# Patient Record
Sex: Female | Born: 1970 | Race: White | Hispanic: No | Smoking: Never smoker
Health system: Southern US, Community
[De-identification: ages and names within clinical notes are randomized; demographics above are authoritative.]

## PROBLEM LIST (undated history)

## (undated) DIAGNOSIS — N644 Mastodynia: Secondary | ICD-10-CM

## (undated) DIAGNOSIS — N898 Other specified noninflammatory disorders of vagina: Secondary | ICD-10-CM

## (undated) DIAGNOSIS — N8189 Other female genital prolapse: Secondary | ICD-10-CM

## (undated) DIAGNOSIS — O99345 Other mental disorders complicating the puerperium: Secondary | ICD-10-CM

## (undated) DIAGNOSIS — N83209 Unspecified ovarian cyst, unspecified side: Secondary | ICD-10-CM

## (undated) DIAGNOSIS — N9089 Other specified noninflammatory disorders of vulva and perineum: Secondary | ICD-10-CM

## (undated) DIAGNOSIS — K649 Unspecified hemorrhoids: Secondary | ICD-10-CM

## (undated) DIAGNOSIS — F53 Postpartum depression: Secondary | ICD-10-CM

## (undated) DIAGNOSIS — IMO0002 Reserved for concepts with insufficient information to code with codable children: Secondary | ICD-10-CM

## (undated) HISTORY — DX: Reserved for concepts with insufficient information to code with codable children: IMO0002

## (undated) HISTORY — PX: WISDOM TOOTH EXTRACTION: SHX21

## (undated) HISTORY — PX: OTHER SURGICAL HISTORY: SHX169

## (undated) HISTORY — PX: BARTHOLIN CYST MARSUPIALIZATION: SHX5383

## (undated) HISTORY — DX: Other specified noninflammatory disorders of vagina: N89.8

## (undated) HISTORY — DX: Mastodynia: N64.4

## (undated) HISTORY — DX: Other female genital prolapse: N81.89

## (undated) HISTORY — DX: Other mental disorders complicating the puerperium: O99.345

## (undated) HISTORY — DX: Unspecified ovarian cyst, unspecified side: N83.209

## (undated) HISTORY — DX: Postpartum depression: F53.0

## (undated) HISTORY — DX: Unspecified hemorrhoids: K64.9

## (undated) HISTORY — DX: Other specified noninflammatory disorders of vulva and perineum: N90.89

---

## 1997-08-15 ENCOUNTER — Ambulatory Visit (HOSPITAL_COMMUNITY): Admission: RE | Admit: 1997-08-15 | Discharge: 1997-08-15 | Payer: Self-pay | Admitting: Obstetrics and Gynecology

## 1997-09-24 ENCOUNTER — Inpatient Hospital Stay (HOSPITAL_COMMUNITY): Admission: AD | Admit: 1997-09-24 | Discharge: 1997-09-24 | Payer: Self-pay | Admitting: Obstetrics and Gynecology

## 1997-11-09 ENCOUNTER — Inpatient Hospital Stay: Admission: AD | Admit: 1997-11-09 | Discharge: 1997-11-09 | Payer: Self-pay | Admitting: Obstetrics and Gynecology

## 1997-12-07 ENCOUNTER — Inpatient Hospital Stay (HOSPITAL_COMMUNITY): Admission: AD | Admit: 1997-12-07 | Discharge: 1997-12-09 | Payer: Self-pay | Admitting: Obstetrics and Gynecology

## 1999-03-04 ENCOUNTER — Other Ambulatory Visit: Admission: RE | Admit: 1999-03-04 | Discharge: 1999-03-04 | Payer: Self-pay | Admitting: Obstetrics and Gynecology

## 2002-06-02 DIAGNOSIS — N644 Mastodynia: Secondary | ICD-10-CM

## 2002-06-02 DIAGNOSIS — K649 Unspecified hemorrhoids: Secondary | ICD-10-CM

## 2002-06-02 HISTORY — DX: Unspecified hemorrhoids: K64.9

## 2002-06-02 HISTORY — DX: Mastodynia: N64.4

## 2002-09-20 ENCOUNTER — Other Ambulatory Visit: Admission: RE | Admit: 2002-09-20 | Discharge: 2002-09-20 | Payer: Self-pay | Admitting: Obstetrics and Gynecology

## 2003-09-27 ENCOUNTER — Other Ambulatory Visit: Admission: RE | Admit: 2003-09-27 | Discharge: 2003-09-27 | Payer: Self-pay | Admitting: Obstetrics and Gynecology

## 2004-06-02 DIAGNOSIS — N83209 Unspecified ovarian cyst, unspecified side: Secondary | ICD-10-CM

## 2004-06-02 HISTORY — DX: Unspecified ovarian cyst, unspecified side: N83.209

## 2004-10-08 ENCOUNTER — Other Ambulatory Visit: Admission: RE | Admit: 2004-10-08 | Discharge: 2004-10-08 | Payer: Self-pay | Admitting: Obstetrics and Gynecology

## 2005-10-14 ENCOUNTER — Other Ambulatory Visit: Admission: RE | Admit: 2005-10-14 | Discharge: 2005-10-14 | Payer: Self-pay | Admitting: Obstetrics and Gynecology

## 2006-01-18 ENCOUNTER — Inpatient Hospital Stay (HOSPITAL_COMMUNITY): Admission: AD | Admit: 2006-01-18 | Discharge: 2006-01-18 | Payer: Self-pay | Admitting: Obstetrics and Gynecology

## 2006-04-16 ENCOUNTER — Inpatient Hospital Stay (HOSPITAL_COMMUNITY): Admission: AD | Admit: 2006-04-16 | Discharge: 2006-04-16 | Payer: Self-pay | Admitting: Obstetrics and Gynecology

## 2006-04-17 ENCOUNTER — Inpatient Hospital Stay (HOSPITAL_COMMUNITY): Admission: AD | Admit: 2006-04-17 | Discharge: 2006-04-17 | Payer: Self-pay | Admitting: Obstetrics and Gynecology

## 2006-06-01 ENCOUNTER — Inpatient Hospital Stay (HOSPITAL_COMMUNITY): Admission: AD | Admit: 2006-06-01 | Discharge: 2006-06-03 | Payer: Self-pay | Admitting: Obstetrics and Gynecology

## 2006-12-14 ENCOUNTER — Inpatient Hospital Stay (HOSPITAL_COMMUNITY): Admission: AD | Admit: 2006-12-14 | Discharge: 2006-12-14 | Payer: Self-pay | Admitting: Obstetrics and Gynecology

## 2006-12-17 ENCOUNTER — Ambulatory Visit (HOSPITAL_COMMUNITY): Admission: RE | Admit: 2006-12-17 | Discharge: 2006-12-17 | Payer: Self-pay | Admitting: Obstetrics and Gynecology

## 2010-06-02 DIAGNOSIS — N9089 Other specified noninflammatory disorders of vulva and perineum: Secondary | ICD-10-CM

## 2010-06-02 HISTORY — DX: Other specified noninflammatory disorders of vulva and perineum: N90.89

## 2010-07-15 ENCOUNTER — Other Ambulatory Visit: Payer: Self-pay | Admitting: Obstetrics and Gynecology

## 2010-07-15 DIAGNOSIS — Z1231 Encounter for screening mammogram for malignant neoplasm of breast: Secondary | ICD-10-CM

## 2010-07-25 ENCOUNTER — Ambulatory Visit
Admission: RE | Admit: 2010-07-25 | Discharge: 2010-07-25 | Disposition: A | Discharge status: Home / Self Care | Payer: Managed Care, Other (non HMO) | Source: Ambulatory Visit | Attending: Obstetrics and Gynecology | Admitting: Obstetrics and Gynecology

## 2010-07-25 DIAGNOSIS — Z1231 Encounter for screening mammogram for malignant neoplasm of breast: Secondary | ICD-10-CM

## 2010-10-15 NOTE — H&P (Signed)
[name], [name]               ACCOUNT NO.:  [id number]   MEDICAL RECORD NO.:  [id number]          PATIENT TYPE:  AMB   LOCATION:  SDC                           FACILITY:  [facility]   PHYSICIAN:  Crist Fat. Rivard, M.D. DATE OF BIRTH:  09-11-1970   DATE OF ADMISSION:  12/17/2006  DATE OF DISCHARGE:                              HISTORY & PHYSICAL   REASON FOR ADMISSION:  Recurrent right Bartholin gland abscess.   HISTORY OF PRESENT ILLNESS:  This is a 40 year old married white female  who presents today with persistence of right Bartholin gland abscess  after having it incised and drained on July 14 and after having  completed one full course of Keflex and currently being on her second  course.  She reports swelling and pain in the right area.  This requires  the use of Vicodin every 4 hours and brings minimal relief.  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  Her left side  Bartholin gland was marsupialized many years ago and this achieved great  success.  She is in today to undergo marsupialization of the right  Bartholin gland.   PAST MEDICAL HISTORY:  1. Wisdom teeth removal.  2. Gravida 3, para 3 with three spontaneous vaginal deliveries in      September 1994, July 1999 and December 2007.   ALLERGIES:  There are no known drug allergies.  No latex allergies.   CURRENT MEDICATIONS:  1. Zoloft 50 mg p.o. daily.  2. Keflex 500 mg p.o. t.i.d.   SOCIAL HISTORY:  Married.  Is an Art gallery manager.  Lives with her husband and 3  children.  Nonsmoker.   FAMILY HISTORY:  Both parents with hypertension.  Mother with type 2  diabetes.   REVIEW OF SYSTEMS:  Negative.   PHYSICAL EXAMINATION:  VITAL SIGNS:  Current weight is 157 pounds for a  height of 5 feet 3-1/2 inches.  Blood pressure 122/82.  HEAD, EYES, EARS, NOSE AND THROAT:  Negative.  HEART:  Normal.  LUNGS:  Clear.  ABDOMEN:  Soft,  nontender.  No hepatosplenomegaly.  EXTREMITIES:  Negative.  NEUROLOGIC:  Within normal limits.  GYN:  Examination reveals an inflamed and painful right Bartholin gland,  increased in size, measuring 5 x 2.5 cm, with a previous site of  incision and drainage still persistent.  Pelvic examination is deferred  at this time.   ASSESSMENT:  Recurrent right Bartholin gland abscess.   PLAN:  Right Bartholin abscess marsupialization with Dr. Pennie Rushing.  The  procedure has been reviewed with the patient including risks and  benefits.  The patient is instructed to continue her antibiotic regimen  post procedure as well as to perform sitz baths twice a day for 5 to 7  days.  A followup appointment is scheduled in 2 weeks with Dr. Estanislado Pandy in  the office.      Crist Fat Rivard, M.D.  Electronically Signed     SAR/MEDQ  D:  12/17/2006  T:  12/17/2006  Job:  [id number]

## 2010-10-15 NOTE — Op Note (Signed)
NAMEAARYANA, BETKE               ACCOUNT NO.:  [id number]   MEDICAL RECORD NO.:  [id number]          PATIENT TYPE:  AMB   LOCATION:  SDC                           [facility]ILITY:  [facility]   PHYSICIAN:  Hal Morales, M.D.DATE OF BIRTH:  05-18-1971   DATE OF PROCEDURE:  12/17/2006  DATE OF DISCHARGE:                               OPERATIVE REPORT   PREOPERATIVE DIAGNOSIS:  Recurrent right Bartholin's abscess.   POSTOPERATIVE DIAGNOSIS:  Recurrent right Bartholin's abscess.   OPERATION:  Right Bartholin's gland marsupialization.   SURGEON:  Dr. Dierdre Forth   ANESTHESIA:  General LMA.   ESTIMATED BLOOD LOSS:  Less than 25 mL.   COMPLICATIONS:  None.   FINDINGS:  The Bartholin's gland was enlarged to approximately 5 x 5 cm  and was exquisitely tender.  Upon entry into the Bartholin's gland a  large amount of purulent material egressed.   PROCEDURE:  The patient was taken to the operating room after  appropriate identification placed on the operating table.  After the  attainment of adequate general anesthesia she was placed in the  lithotomy position.  The perineum and vagina were prepped with multiple  layers of Betadine and draped in sterile field.  The bladder was emptied  with an in-and-out catheter.  The right Bartholin's gland was then  incised along the squamomucosal line and a large amount of purulent  materials egressed from the 3 cm opening.  Copious irrigation was  carried out and interrupted sutures of 4-0 Vicryl were used to sew the  gland mucosa to the vulvar mucosa, allowing for a circumferential  opening of the Bartholin's gland.  Once this was carried out.  Hemostasis was noted to be adequate and copious irrigation again carried  out.  The patient was awakened from general anesthesia and taken to the  recovery room in satisfactory condition having tolerated the procedure  well with sponge and instrument counts correct.      Hal Morales, M.D.  Electronically Signed     VPH/MEDQ  D:  12/17/2006  T:  12/17/2006  Job:  [id number]

## 2010-10-18 NOTE — H&P (Signed)
NAMEGENNETT, GARCIA               ACCOUNT NO.:  [id number]   MEDICAL RECORD NO.:  [id number]          PATIENT TYPE:  INP   LOCATION:  9170                          [facility]ACILITY:  [facility]   PHYSICIAN:  Crist Fat. Rivard, M.D. DATE OF BIRTH:  Aug 10, 1970   DATE OF ADMISSION:  06/01/2006  DATE OF DISCHARGE:                              HISTORY & PHYSICAL   Ms. Jon Billings is a 40 year old, gravida 3, para 2-0-0-2 at 39-6/7 weeks  who presented initially for induction secondary to advanced cervical  change but presented this morning contracting every 5 minutes. The  cervix has been 3 cm in the office. The pregnancy has been remarkable  for 1) first trimester bleeding, 2) advanced maternal age with amnio  decline and quadruple screen decline.   PRENATAL LABS:  Blood type is A+, Rh antibody negative, VDRL  nonreactive, rubella titer positive, hepatitis B surface antigen  negative, cystic fibrosis testing was negative, GC and chlamydia  cultures were negative. Pap was normal in March. Hemoglobin upon  entering the practice was 14.3, it was 12.3 at 26 weeks. EDC of June 09, 2006 was established by first trimester ultrasound and was in  agreement with LMP. The patient declined quadruple screen and amnio.  Glucola was elevated at 138, three-hour GTT was normal. She had a fetal  fibronectin at 29 weeks which was negative. She had a negative group B  strep at term.   HISTORY OF PRESENT PREGNANCY:  The patient entered care at approximately  10 weeks. She had an ultrasound in the first trimester secondary to  first trimester bleeding. She then had another ultrasound at 19 weeks  showing normal growth and development. She declined amnio and quadruple  screen. Her adjusted risk for Down syndrome was 1/476. Hemoglobin was  normal, Glucola was elevated at 138. She had a negative 3-hour GTT. She  did have some viral symptoms at 27 weeks. She did resolve spontaneously.  At 29 weeks, she was having some issues  with skin tags on her chest. She  requested these be removed, these were removed by Henreitta Leber at 30  weeks. She also had some contractions at that time and was evaluated  with a fetal fibronectin. The rest of her pregnancy was uncomplicated.  She had a GC and chlamydia, group B strep culture done at 35 weeks that  was normal.   OBSTETRICAL HISTORY:  In 1994, she had a vaginal birth of a female  infant, weight 7 pounds 14 ounces at 39 weeks, she was in labor 2 hours.  It was a vacuum-assisted vaginal birth. In 1999, she had a vaginal birth  of a female infant that weighed 8 pounds 14 ounces at 40 weeks, she was in  labor 13 hours, she had no anesthesia.   MEDICAL HISTORY:  She was on birth control pills in the past and a  Nuvaring in the past. She reports the usual childhood illnesses. She had  a UTI treated in the past with antibiotics.   PAST SURGICAL HISTORY:  Wisdom teeth removal.   Her only other hospitalization was for childbirth. She  has sensitivity  to carob which causes hives.   FAMILY HISTORY:  Paternal grandfather had heart disease, both parents  have hypertension and are on medication. Her sister and aunt have  varicose veins, her mother has type 2 diabetes, her mother has had a  stroke. Maternal aunt had lung cancer, paternal uncle had lung cancer.   GENETIC HISTORY:  Remarkable for the patient's advanced maternal age of  37. Her paternal aunt had a children with heart disease and her brother  has twins.   SOCIAL HISTORY:  The patient is married to the father of the baby. He is  involved and supportive. His name is Sinclair Grooms. The patient is  college educated, she is an Art gallery manager. Her husband is high school  educated, he is employed with [employer], clinical. She has been followed  by the Certified Nurse Midwife Service  at [facility]. She  denies any alcohol, drug or tobacco use during this pregnancy. She is  Caucasian and of the [place] faith.    PHYSICAL EXAMINATION:  VITAL SIGNS:  Stable, the patient is afebrile.  HEENT:  Within normal limits.  LUNGS:  Bilateral breath sounds are clear.  HEART:  Regular rate and rhythm without murmur.  BREASTS:  Soft and nontender.  ABDOMEN:  Fundal height is approximately 38 cm, estimated fetal weight 7-  8 pounds. Uterine contractions are every 5-6 minutes, moderate quality.  Cervical exam is 4 cm, 80% vertex at a -1 station with an intact bag of  water. Fetal heart rate is active with no decelerations.  EXTREMITIES:  Deep tendon reflexes are 2+ without clonus, there is no  trace edema noted.   IMPRESSION:  1. Intrauterine pregnancy at 39-6/7 weeks.  2. Early labor.  3. Negative group B strep.   PLAN:  1. Admit to birthing suite for consult with Dr. Estanislado Pandy as attending      physician.  2. Routine certified nurse midwife orders.  3. Plan observation at present and then will reevaluate in      approximately 2 hours for possible artificial rupture of membranes      for labor augmentation.  4. Pain medication p.r.n. per patient request.      Renaldo Reel. Emilee Hero, C.N.M.      Crist Fat Rivard, M.D.  Electronically Signed    VLL/MEDQ  D:  06/01/2006  T:  06/01/2006  Job:  [id number]

## 2011-03-17 LAB — CBC
MCHC: 34
MCV: 87.1
RBC: 4.53
RDW: 13.2

## 2011-03-18 LAB — CBC
MCHC: 34.8
MCV: 86.5
Platelets: 172
WBC: 9.7

## 2011-03-18 LAB — DIFFERENTIAL
Basophils Relative: 0
Eosinophils Absolute: 0
Lymphs Abs: 0.6 — ABNORMAL LOW
Neutro Abs: 8.8 — ABNORMAL HIGH
Neutrophils Relative %: 91 — ABNORMAL HIGH

## 2011-06-18 ENCOUNTER — Other Ambulatory Visit: Payer: Self-pay | Admitting: Obstetrics and Gynecology

## 2011-06-18 DIAGNOSIS — Z1231 Encounter for screening mammogram for malignant neoplasm of breast: Secondary | ICD-10-CM

## 2011-07-28 ENCOUNTER — Ambulatory Visit
Admission: RE | Admit: 2011-07-28 | Discharge: 2011-07-28 | Disposition: A | Discharge status: Home / Self Care | Payer: Managed Care, Other (non HMO) | Source: Ambulatory Visit | Attending: Obstetrics and Gynecology | Admitting: Obstetrics and Gynecology

## 2011-07-28 DIAGNOSIS — Z1231 Encounter for screening mammogram for malignant neoplasm of breast: Secondary | ICD-10-CM

## 2011-07-30 DIAGNOSIS — R87619 Unspecified abnormal cytological findings in specimens from cervix uteri: Secondary | ICD-10-CM

## 2011-07-30 DIAGNOSIS — IMO0002 Reserved for concepts with insufficient information to code with codable children: Secondary | ICD-10-CM

## 2011-07-30 HISTORY — DX: Reserved for concepts with insufficient information to code with codable children: IMO0002

## 2011-07-30 HISTORY — DX: Unspecified abnormal cytological findings in specimens from cervix uteri: R87.619

## 2011-12-11 ENCOUNTER — Ambulatory Visit (INDEPENDENT_AMBULATORY_CARE_PROVIDER_SITE_OTHER): Payer: BC Managed Care – PPO | Admitting: Obstetrics and Gynecology

## 2011-12-11 ENCOUNTER — Encounter: Payer: Self-pay | Admitting: Obstetrics and Gynecology

## 2011-12-11 VITALS — BP 100/64 | Ht 64.0 in | Wt 142.0 lb

## 2011-12-11 DIAGNOSIS — Z309 Encounter for contraceptive management, unspecified: Secondary | ICD-10-CM

## 2011-12-11 DIAGNOSIS — R87610 Atypical squamous cells of undetermined significance on cytologic smear of cervix (ASC-US): Secondary | ICD-10-CM

## 2011-12-11 NOTE — Progress Notes (Signed)
HISTORY OF PRESENT ILLNESS  [name] is a 41 y.o. year old female,No obstetric history on file., who presents for a problem visit. She had an IUD placed in 2008.  She wants to have it removed.  She no longer needs to use contraception.  She had a Pap smear in February of 2013 which showed ASCUS.  HPV was negative.  Subjective:  Doing well.  Her children are going to [place] for the summer.  Objective:  BP 100/64  Ht 5\' 4"  (1.626 m)  Wt 142 lb (64.411 kg)  BMI 24.37 kg/m2  LMP 12/04/2011   General: alert and no distress GI: soft, non-tender; bowel sounds normal; no masses,  no organomegaly  External genitalia: normal general appearance Vaginal: normal without tenderness, induration or masses and relaxation noted Cervix: normal appearance and IUD string visualized Adnexa: normal bimanual exam Uterus: normal size shape and consistency  Procedure to remove the IUD:  Procedure discussed with the patient.  Questions answered.  Speculum exam performed.  The IUD string is held with a ring forcep.  The patient was asked to cough.  The IUD was removed in its entirety without difficulty.  The patient tolerated the procedure well.  There is no bleeding noted.  Assessment:  Ascus Pap Contraceptive management  Plan:  Repeat Pap in August 2013  Return to office in 7 month(s) for annual exam.   Leonard Schwartz M.D.  12/11/2011 2:45 PM

## 2012-01-05 ENCOUNTER — Ambulatory Visit (INDEPENDENT_AMBULATORY_CARE_PROVIDER_SITE_OTHER): Payer: BC Managed Care – PPO | Admitting: Obstetrics and Gynecology

## 2012-01-05 ENCOUNTER — Encounter: Payer: Self-pay | Admitting: Obstetrics and Gynecology

## 2012-01-05 VITALS — BP 100/60 | Ht 64.0 in | Wt 142.0 lb

## 2012-01-05 DIAGNOSIS — Z124 Encounter for screening for malignant neoplasm of cervix: Secondary | ICD-10-CM

## 2012-01-05 DIAGNOSIS — N920 Excessive and frequent menstruation with regular cycle: Secondary | ICD-10-CM

## 2012-01-05 DIAGNOSIS — R87610 Atypical squamous cells of undetermined significance on cytologic smear of cervix (ASC-US): Secondary | ICD-10-CM

## 2012-01-05 MED ORDER — NORETHIN ACE-ETH ESTRAD-FE 1-20 MG-MCG PO TABS: 1.0000 | ORAL_TABLET | Freq: Every day | ORAL | Status: DC

## 2012-01-05 NOTE — Progress Notes (Signed)
HISTORY OF PRESENT ILLNESS  [name] is a 41 y.o. year old female,[id number], who presents for a problem visit. The patient has a history of an ASCUS Pap.  Subjective:  The patient reports that since her IUD was removed her periods have been very heavy.  Objective:  BP 100/60  Ht 5\' 4"  (1.626 m)  Wt 142 lb (64.411 kg)  BMI 24.37 kg/m2  LMP 12/31/2011   General: alert and no distress GI: soft, non-tender; bowel sounds normal; no masses,  no organomegaly  External genitalia: normal general appearance Vaginal: normal without tenderness, induration or masses and relaxation noted Cervix: normal appearance Adnexa: normal bimanual exam Uterus: normal size shape and consistency  Assessment:  Ascus Pap Heavy menses  Plan:  Pap smear sent Contraceptive options were reviewed.  Loestrin 1.0/ 20 sent to the pharmacy.  The patient may want Mirena IUD.  Return to office in 6 month(s).   Leonard Schwartz M.D.  01/05/2012 5:39 PM    Last Pap Normal: no Date: 07/21/2011 Grade: ASC-US High Risk HPV: no Vaginal Discharge:no Prior LEEP:no Prior Conization:no Prior Cryotherapy:no Prior Lazer:no

## 2012-01-06 ENCOUNTER — Telehealth: Payer: Self-pay | Admitting: Obstetrics and Gynecology

## 2012-01-06 NOTE — Telephone Encounter (Signed)
TRIAGE/RX °

## 2012-01-07 ENCOUNTER — Other Ambulatory Visit: Payer: Self-pay

## 2012-01-07 LAB — PAP IG W/ RFLX HPV ASCU

## 2012-01-07 NOTE — Telephone Encounter (Signed)
Pt called because her pharmacy had not received Loestrin rx. [facility] [place] [facility] [address]. Rx sent to correct pharmacy, pt notified and voiced understanding.

## 2012-05-07 ENCOUNTER — Ambulatory Visit: Payer: Self-pay | Admitting: Internal Medicine

## 2012-05-07 LAB — WET PREP, GENITAL

## 2012-08-12 ENCOUNTER — Telehealth: Payer: Self-pay | Admitting: Obstetrics and Gynecology

## 2012-08-12 MED ORDER — NORETHIN ACE-ETH ESTRAD-FE 1-20 MG-MCG PO TABS: 1.0000 | ORAL_TABLET | Freq: Every day | ORAL | Status: DC

## 2012-08-12 NOTE — Telephone Encounter (Signed)
Spoke with pt rgd msg informed rx sent to pharm pt voice understanding 

## 2012-09-04 ENCOUNTER — Inpatient Hospital Stay (HOSPITAL_COMMUNITY)
Admission: EM | Admit: 2012-09-04 | Discharge: 2012-09-08 | DRG: 078 | Disposition: A | Discharge status: Home / Self Care | Payer: BC Managed Care – PPO | Attending: Internal Medicine | Admitting: Internal Medicine

## 2012-09-04 ENCOUNTER — Encounter (HOSPITAL_COMMUNITY): Payer: Self-pay | Admitting: Emergency Medicine

## 2012-09-04 ENCOUNTER — Emergency Department (HOSPITAL_COMMUNITY): Payer: BC Managed Care – PPO

## 2012-09-04 DIAGNOSIS — J90 Pleural effusion, not elsewhere classified: Secondary | ICD-10-CM | POA: Diagnosis present

## 2012-09-04 DIAGNOSIS — Z309 Encounter for contraceptive management, unspecified: Secondary | ICD-10-CM

## 2012-09-04 DIAGNOSIS — Z79899 Other long term (current) drug therapy: Secondary | ICD-10-CM

## 2012-09-04 DIAGNOSIS — I2699 Other pulmonary embolism without acute cor pulmonale: Secondary | ICD-10-CM | POA: Diagnosis present

## 2012-09-04 LAB — POCT I-STAT, CHEM 8
Calcium, Ion: 1.21 mmol/L (ref 1.12–1.23)
Chloride: 102 mEq/L (ref 96–112)
Creatinine, Ser: 1 mg/dL (ref 0.50–1.10)
Glucose, Bld: 112 mg/dL — ABNORMAL HIGH (ref 70–99)
HCT: 45 % (ref 36.0–46.0)
Hemoglobin: 15.3 g/dL — ABNORMAL HIGH (ref 12.0–15.0)

## 2012-09-04 MED ORDER — ONDANSETRON HCL 4 MG/2ML IJ SOLN
4.0000 mg | Freq: Four times a day (QID) | INTRAMUSCULAR | Status: DC | PRN
  Administered 2012-09-06: 4 mg via INTRAVENOUS
  Filled 2012-09-04 (×2): qty 2

## 2012-09-04 MED ORDER — MORPHINE SULFATE 2 MG/ML IJ SOLN
INTRAMUSCULAR | Status: AC
  Administered 2012-09-04: 2 mg via INTRAVENOUS
  Filled 2012-09-04: qty 1

## 2012-09-04 MED ORDER — IOHEXOL 350 MG/ML SOLN
100.0000 mL | Freq: Once | INTRAVENOUS | Status: AC | PRN
  Administered 2012-09-04: 100 mL via INTRAVENOUS

## 2012-09-04 MED ORDER — SODIUM CHLORIDE 0.9 % IJ SOLN
3.0000 mL | Freq: Two times a day (BID) | INTRAMUSCULAR | Status: DC
  Administered 2012-09-08: 3 mL via INTRAVENOUS

## 2012-09-04 MED ORDER — DOCUSATE SODIUM 100 MG PO CAPS
100.0000 mg | ORAL_CAPSULE | Freq: Two times a day (BID) | ORAL | Status: DC
  Administered 2012-09-04 – 2012-09-08 (×8): 100 mg via ORAL
  Filled 2012-09-04 (×9): qty 1

## 2012-09-04 MED ORDER — ONDANSETRON HCL 4 MG PO TABS: 4.0000 mg | ORAL_TABLET | Freq: Four times a day (QID) | ORAL | Status: DC | PRN

## 2012-09-04 MED ORDER — HEPARIN (PORCINE) IN NACL 100-0.45 UNIT/ML-% IJ SOLN
1350.0000 [IU]/h | INTRAMUSCULAR | Status: DC
  Administered 2012-09-04: 1100 [IU]/h via INTRAVENOUS
  Administered 2012-09-05: 1200 [IU]/h via INTRAVENOUS
  Administered 2012-09-06 – 2012-09-07 (×3): 1350 [IU]/h via INTRAVENOUS
  Filled 2012-09-04 (×8): qty 250

## 2012-09-04 MED ORDER — MORPHINE SULFATE 2 MG/ML IJ SOLN
2.0000 mg | INTRAMUSCULAR | Status: DC | PRN
  Administered 2012-09-05 (×6): 2 mg via INTRAVENOUS
  Administered 2012-09-05 – 2012-09-06 (×2): 4 mg via INTRAVENOUS
  Filled 2012-09-04: qty 1
  Filled 2012-09-04: qty 2
  Filled 2012-09-04 (×6): qty 1
  Filled 2012-09-04: qty 2

## 2012-09-04 MED ORDER — SODIUM CHLORIDE 0.9 % IJ SOLN
3.0000 mL | Freq: Two times a day (BID) | INTRAMUSCULAR | Status: DC
  Administered 2012-09-07: 3 mL via INTRAVENOUS

## 2012-09-04 MED ORDER — HEPARIN SODIUM (PORCINE) 5000 UNIT/ML IJ SOLN: 4000.0000 [IU] | Freq: Once | INTRAMUSCULAR | Status: DC

## 2012-09-04 MED ORDER — SODIUM CHLORIDE 0.9 % IV SOLN: 250.0000 mL | INTRAVENOUS | Status: DC | PRN

## 2012-09-04 MED ORDER — MORPHINE SULFATE 2 MG/ML IJ SOLN
2.0000 mg | INTRAMUSCULAR | Status: DC | PRN
  Filled 2012-09-04: qty 1

## 2012-09-04 MED ORDER — HEPARIN BOLUS VIA INFUSION
4000.0000 [IU] | Freq: Once | INTRAVENOUS | Status: AC
  Administered 2012-09-04: 4000 [IU] via INTRAVENOUS

## 2012-09-04 MED ORDER — SODIUM CHLORIDE 0.9 % IJ SOLN: 3.0000 mL | INTRAMUSCULAR | Status: DC | PRN

## 2012-09-04 NOTE — Progress Notes (Signed)
Admitted at 2200 and pain was 8/10 - gave 2 mg of MSO4, pain now 6/10 but still visibly in pain.  Not allowed to give more MSO4 for an hour - have notified [name] - awaiting repsonse.  VSS sat remains 100% on 2l/m - reports pain is "spasm" on l chest/side.

## 2012-09-04 NOTE — ED Notes (Signed)
Pt states she woke up with chest pain and SOB this morning. Was sent to ED by urgent care for pulmonary tests.

## 2012-09-04 NOTE — ED Notes (Signed)
Patient transported to CT 

## 2012-09-04 NOTE — Progress Notes (Signed)
ANTICOAGULATION CONSULT NOTE - Initial Consult  Pharmacy Consult for IV heparin Indication: pulmonary embolus  No Known Allergies  Patient Measurements: Height: 5\' 3"  (160 cm) Weight: 145 lb (65.772 kg) IBW/kg (Calculated) : 52.4 Heparin Dosing Weight: 65 kg  Vital Signs: Temp: 98.5 F (36.9 C) (04/05 1921) Temp src: Oral (04/05 1921) BP: 137/80 mmHg (04/05 1921) Pulse Rate: 101 (04/05 1726)  Labs:  Recent Labs  09/04/12 1819  HGB 15.3*  HCT 45.0  CREATININE 1.00    Estimated Creatinine Clearance: 66.9 ml/min (by C-G formula based on Cr of 1).   Medical History: Past Medical History  Diagnosis Date  . Pelvic relaxation   . Abnormal Pap smear 07/30/11    ASCUS  . Vulvar lesion 2012  . Vaginal discharge     recurrent  . Simple ovarian cyst 2006    left   . Post partum depression     hx of   . Hemorrhoids 2004  . Breast pain, right 2004    Assessment:  41 yof presented 4/5 with c/o chest pain and SOB.  CT angio here positive for bilateral lower lobe and R middle lobe pulmonary emboli.  MD ordered to start IV heparin per pharmacy  PT/INR and aPTT pending.  H/H wnl on I-STAT, chem 8. Pt was not on any anticoagulant PTA, and was taking norethindrone-ethinyl estradiol PTA.   Pt wts 65.8 g, heparin weight is 65 kg. Scr wnl.   Goal of Therapy:  Heparin level 0.3-0.7 units/ml Monitor platelets by anticoagulation protocol: Yes   Plan:   IV heparin 4000 units bolus x 1 then 1100 units/hr   Heparin level 6 hours prior to initiation of IV heparin  CBC and heparin level daily  F/u baseline labs and long-term anticoagulation plans  Geoffry Paradise, PharmD, BCPS Pager: [phone] 7:50 PM Pharmacy #: [id number]

## 2012-09-04 NOTE — ED Provider Notes (Signed)
History     CSN: [id number]  Arrival date & time 09/04/12  1716   First MD Initiated Contact with Patient 09/04/12 1724      Chief Complaint  Patient presents with  . Shortness of Breath  . Chest Pain    (Consider location/radiation/quality/duration/timing/severity/associated sxs/prior treatment) HPI Patient reports feeling well until this morning when she woke up with some left shoulder/neck pain.  This improved with a hot shower, but she noticed left side rib pain.  She describes it as muscle soreness, like after coughing, but she denies any coughing.  Feels like she is taking shallow breaths due to the pain.  Pain is worse with deep inspiration and movement.  No fevers, chills, recent illnesses.  No lower extremity pain, swelling or erythema.  Has been on birth control pills for 1 year.  No personal or family history of blood clots.  Did have a prolonged car trip to [place] 5 days ago.    She was seen at Urgent Care and told to go the ED for further evaluation for possible PE.  Past Medical History  Diagnosis Date  . Pelvic relaxation   . Abnormal Pap smear 07/30/11    ASCUS  . Vulvar lesion 2012  . Vaginal discharge     recurrent  . Simple ovarian cyst 2006    left   . Post partum depression     hx of   . Hemorrhoids 2004  . Breast pain, right 2004    Past Surgical History  Procedure Laterality Date  . 2008 vaginal cyst removal    . Wisdom tooth extraction    . Bartholin cyst marsupialization      right gland    Family History  Problem Relation Age of Onset  . Diabetes Mother     type 2  . Hypertension Mother   . Stroke Mother   . Hypertension Father   . Cancer Maternal Aunt     lung  . Cancer Paternal Uncle     lung  . Heart disease Paternal Grandfather     History  Substance Use Topics  . Smoking status: Never Smoker   . Smokeless tobacco: Never Used  . Alcohol Use: 0.5 oz/week    1 drink(s) per week    OB History   Grav Para Term Preterm  Abortions TAB SAB Ect Mult Living   4 3   1     3       Review of Systems  Constitutional: Negative for fever and chills.  HENT: Negative.   Eyes: Negative.   Respiratory: Positive for shortness of breath. Negative for cough.   Cardiovascular: Positive for chest pain. Negative for leg swelling.  Gastrointestinal: Negative.   Genitourinary: Negative.   Musculoskeletal: Negative.   Skin: Negative.   Neurological: Negative.     Allergies  Review of patient's allergies indicates no known allergies.  Home Medications   Current Outpatient Rx  Name  Route  Sig  Dispense  Refill  . norethindrone-ethinyl estradiol (JUNEL FE,GILDESS FE,LOESTRIN FE) 1-20 MG-MCG tablet   Oral   Take 1 tablet by mouth daily.   1 Package   7     BP 134/80  Pulse 101  Temp(Src) 98.9 F (37.2 C) (Oral)  Ht 5\' 3"  (1.6 m)  Wt 145 lb (65.772 kg)  BMI 25.69 kg/m2  SpO2 96%  LMP 08/23/2012  Physical Exam  Constitutional: She appears well-developed and well-nourished. She appears distressed (appears anxious).  HENT:  Head: Normocephalic  and atraumatic.  Right Ear: External ear normal.  Left Ear: External ear normal.  Mouth/Throat: Oropharynx is clear and moist. No oropharyngeal exudate.  Eyes: Conjunctivae and EOM are normal. Pupils are equal, round, and reactive to light. Right eye exhibits no discharge. Left eye exhibits no discharge.  Neck: Normal range of motion. Neck supple.  Cardiovascular: Regular rhythm, S1 normal, S2 normal and intact distal pulses.  Tachycardia present.   No murmur heard. Pulmonary/Chest: Effort normal and breath sounds normal. No respiratory distress. She has no wheezes. She has no rales. She exhibits tenderness (at apex).  Abdominal: Soft. Bowel sounds are normal. She exhibits no distension. There is no tenderness. There is no rebound and no guarding.  Musculoskeletal:       Right lower leg: Normal. She exhibits no tenderness, no swelling and no edema.       Left lower  leg: Normal. She exhibits no tenderness, no swelling and no edema.  Lymphadenopathy:    She has no cervical adenopathy.  Skin: Skin is warm and dry. No rash noted. She is not diaphoretic. No erythema.  Psychiatric: She has a normal mood and affect. Her behavior is normal. Judgment and thought content normal.    ED Course  Procedures (including critical care time)  Labs Reviewed - No data to display No results found.   No diagnosis found.    MDM  42 yo otherwise healthy female with pleuritic chest pain, tachycardia, and on birth control.  High suspicion for PE.  Will check chemistries and urine preg and get CT angio.  CTA shows bilateral PEs.  Will start heparin for anticoagulation and call for admission.  Phebe Colla, MD 09/04/12 2003

## 2012-09-04 NOTE — H&P (Signed)
[facility] History and Physical  [name] JYN:[id number] DOB: [birth year]    PCP:   Mickie Hillier, MD   Chief Complaint: Acute bilateral pulmonary embolism.  HPI: [name] is an 42 y.o. female on birth control (Junel 1-20/Fe.) for menorrhagia for at least one year, postpartum depression, nonsmoker, presents to the emergency room with bilateral lower chest discomfort and right shoulder pain. A CT pulmonary angiogram revealed bilateral acute pulmonary emboli with pleural effusion, pulmonary infarct not excluded. She has stable hemodynamics except for heart rate of 110. She has normal hemoglobin. There has been no history of abnormal bleeding. She did admit to have taken a long trip, but denied leg swelling or calf tenderness. She has no prior history of thromboembolic disease. There has been no family history of thromboembolism. She was on the Mirena IUD, but this was removed about a year ago. Hospitalist was asked to admit her for bilateral acute pulmonary emboli.  Rewiew of Systems:  Constitutional: Negative for malaise, fever and chills. No significant weight loss or weight gain Eyes: Negative for eye pain, redness and discharge, diplopia, visual changes, or flashes of light. ENMT: Negative for ear pain, hoarseness, nasal congestion, sinus pressure and sore throat. No headaches; tinnitus, drooling, or problem swallowing. Cardiovascular: Negative for palpitations, diaphoresis,  and peripheral edema. ; No orthopnea, PND Respiratory: Negative for cough, hemoptysis, wheezing and stridor. No pleuritic chestpain. Gastrointestinal: Negative for nausea, vomiting, diarrhea, constipation, abdominal pain, melena, blood in stool, hematemesis, jaundice and rectal bleeding.    Genitourinary: Negative for frequency, dysuria, incontinence,flank pain and hematuria; Musculoskeletal: Negative for back pain and neck pain. Negative for swelling and trauma.;  Skin: . Negative for pruritus, rash,  abrasions, bruising and skin lesion.; ulcerations Neuro: Negative for headache, lightheadedness and neck stiffness. Negative for weakness, altered level of consciousness , altered mental status, extremity weakness, burning feet, involuntary movement, seizure and syncope.  Psych: negative for anxiety, depression, insomnia, tearfulness, panic attacks, hallucinations, paranoia, suicidal or homicidal ideation   Past Medical History  Diagnosis Date  . Pelvic relaxation   . Abnormal Pap smear 07/30/11    ASCUS  . Vulvar lesion 2012  . Vaginal discharge     recurrent  . Simple ovarian cyst 2006    left   . Post partum depression     hx of   . Hemorrhoids 2004  . Breast pain, right 2004    Past Surgical History  Procedure Laterality Date  . 2008 vaginal cyst removal    . Wisdom tooth extraction    . Bartholin cyst marsupialization      right gland    Medications:  HOME MEDS: Prior to Admission medications   Medication Sig Start Date End Date Taking? Authorizing Provider  norethindrone-ethinyl estradiol (JUNEL FE,GILDESS FE,LOESTRIN FE) 1-20 MG-MCG tablet Take 1 tablet by mouth daily. 08/12/12 08/12/13 Yes Kirkland Hun, MD     Allergies:  No Known Allergies  Social History:   reports that she has never smoked. She has never used smokeless tobacco. She reports that she drinks about 0.5 ounces of alcohol per week. She reports that she does not use illicit drugs.  Family History: Family History  Problem Relation Age of Onset  . Diabetes Mother     type 2  . Hypertension Mother   . Stroke Mother   . Hypertension Father   . Cancer Maternal Aunt     lung  . Cancer Paternal Uncle     lung  . Heart disease  Paternal Grandfather      Physical Exam: Filed Vitals:   09/04/12 1726 09/04/12 1921  BP: 134/80 137/80  Pulse: 101   Temp: 98.9 F (37.2 C) 98.5 F (36.9 C)  TempSrc: Oral Oral  Resp:  15  Height: 5\' 3"  (1.6 m)   Weight: 65.772 kg (145 lb)   SpO2: 96% 99%    Blood pressure 137/80, pulse 101, temperature 98.5 F (36.9 C), temperature source Oral, resp. rate 15, height 5\' 3"  (1.6 m), weight 65.772 kg (145 lb), last menstrual period 08/23/2012, SpO2 99.00%.  GEN:  Pleasant  patient lying in the stretcher in no acute distress; cooperative with exam. PSYCH:  alert and oriented x4; does not appear anxious or depressed; affect is appropriate. HEENT: Mucous membranes pink and anicteric; PERRLA; EOM intact; no cervical lymphadenopathy nor thyromegaly or carotid bruit; no JVD; There were no stridor. Neck is very supple. Breasts:: Not examined CHEST WALL: No tenderness CHEST: Normal respiration, clear to auscultation bilaterally.  HEART: Regular rate and rhythm.  There are no murmur, rub, or gallops.   BACK: No kyphosis or scoliosis; no CVA tenderness ABDOMEN: soft and non-tender; no masses, no organomegaly, normal abdominal bowel sounds; no pannus; no intertriginous candida. There is no rebound and no distention. Rectal Exam: Not done EXTREMITIES: No bone or joint deformity; age-appropriate arthropathy of the hands and knees; no edema; no ulcerations.  There is no calf tenderness. Genitalia: not examined PULSES: 2+ and symmetric SKIN: Normal hydration no rash or ulceration CNS: Cranial nerves 2-12 grossly intact no focal lateralizing neurologic deficit.  Speech is fluent; uvula elevated with phonation, facial symmetry and tongue midline. DTR are normal bilaterally, cerebella exam is intact, barbinski is negative and strengths are equaled bilaterally.  No sensory loss.   Labs on Admission:  Basic Metabolic Panel:  Recent Labs Lab 09/04/12 1819  NA 141  K 3.5  CL 102  GLUCOSE 112*  BUN 9  CREATININE 1.00   Liver Function Tests: No results found for this basename: AST, ALT, ALKPHOS, BILITOT, PROT, ALBUMIN,  in the last 168 hours No results found for this basename: LIPASE, AMYLASE,  in the last 168 hours No results found for this basename:  AMMONIA,  in the last 168 hours CBC:  Recent Labs Lab 09/04/12 1819  HGB 15.3*  HCT 45.0   Cardiac Enzymes: No results found for this basename: CKTOTAL, CKMB, CKMBINDEX, TROPONINI,  in the last 168 hours  CBG: No results found for this basename: GLUCAP,  in the last 168 hours   Radiological Exams on Admission: Ct Angio Chest W/cm &/or Wo Cm  09/04/2012  *RADIOLOGY REPORT*  Clinical Data: Shortness of breath.  CT ANGIOGRAPHY CHEST  Technique:  Multidetector CT imaging of the chest using the standard protocol during bolus administration of intravenous contrast. Multiplanar reconstructed images including MIPs were obtained and reviewed to evaluate the vascular anatomy.  Contrast: OMNIPAQUE IOHEXOL 350 MG/ML SOLN  Comparison: None.  Findings: Lung apices not entirely included on the present exam as the patient was not able to cooperate secondary to discomfort.  Bilateral lower lobe and right middle lobe pulmonary emboli.  Right heart does not appear significantly enlarged to indicate pulmonary strain currently.  Basilar atelectatic change greater left with small left-sided pleural effusion. Pulmonary infarct not excluded.  No evidence of aortic dissection.  Slight ectasia ascending thoracic aorta.  Radiopaque material within the left kidney may represent excreted contrast.  Kidney stone not excluded.  Mild thoracic kyphosis without bony  destructive lesion.  No mediastinal or hilar adenopathy.  IMPRESSION: Bilateral lower lobe and right middle lobe pulmonary emboli.  Basilar atelectatic change greater left with small left-sided pleural effusion. Pulmonary infarct not excluded  Critical Value/emergent results were called by telephone at the time of interpretation on 09/04/2012 at 7:34 p.m. to Dr. Radford Pax, who verbally acknowledged these results.   Original Report Authenticated By: [name], M.D.       Assessment/Plan Present on Admission:  . Pulmonary embolism Oral contraceptive  use.   PLAN:  Will admit her for bilateral acute emboli. I would consider this a provoked event. Obviously will discontinue her oral contraceptives. She was given intravenous heparin along with supplemental oxygen. She is stable. I will admit her to telemetry. Will hold off on obtaining any thrombophilia workup.  If the studies were to be considered, it would be best to obtain them after the acute thromboembolic event in any case. She would benefit getting an echo to see if she has any ventricular strain. I will pain as well bilateral lower extremity Doppler to see if she has large clot burden of the lower extremities.  I discussed briefly the pros and con's of Coumadin and Xarelto, but will defer to the rounding team should start her on oral anticoagulants. She has a daughter in college and I told her to have a daughter check with her physician about Leiden factor V especially if her daughter is on oral contraceptives. She is stable, full code, and will [facility]. Thank you for allowing me to participate in the care of your nice patient  Other plans as per orders.  Code Status: full code.   Houston Siren, MD. [facility] Pager [phone] 7pm to 7am.  09/04/2012, 9:09 PM

## 2012-09-04 NOTE — ED Provider Notes (Signed)
I saw and evaluated the patient, reviewed the resident's note and I agree with the findings and plan.   .Face to face Exam:  General:  Awake HEENT:  Atraumatic Resp:  Normal effort Abd:  Nondistended Neuro:No focal weakness Lymph: No adenopathy   CRITICAL CARE Performed by: Nelva Nay L   Total critical care time: 30 min  Critical care time was exclusive of separately billable procedures and treating other patients.  Critical care was necessary to treat or prevent imminent or life-threatening deterioration.  Critical care was time spent personally by me on the following activities: development of treatment plan with patient and/or surrogate as well as nursing, discussions with consultants, evaluation of patient's response to treatment, examination of patient, obtaining history from patient or surrogate, ordering and performing treatments and interventions, ordering and review of laboratory studies, ordering and review of radiographic studies, pulse oximetry and re-evaluation of patient's condition.   Nelia Shi, MD 09/04/12 2012

## 2012-09-04 NOTE — ED Notes (Signed)
POCT Preg resulted Neg. 

## 2012-09-05 DIAGNOSIS — I2699 Other pulmonary embolism without acute cor pulmonale: Secondary | ICD-10-CM

## 2012-09-05 DIAGNOSIS — T81718A Complication of other artery following a procedure, not elsewhere classified, initial encounter: Secondary | ICD-10-CM

## 2012-09-05 LAB — CBC
HCT: 38.1 % (ref 36.0–46.0)
Hemoglobin: 13.3 g/dL (ref 12.0–15.0)
MCHC: 34.9 g/dL (ref 30.0–36.0)
RBC: 4.31 MIL/uL (ref 3.87–5.11)

## 2012-09-05 LAB — HEPARIN LEVEL (UNFRACTIONATED): Heparin Unfractionated: 0.35 IU/mL (ref 0.30–0.70)

## 2012-09-05 LAB — POCT PREGNANCY, URINE: Preg Test, Ur: NEGATIVE

## 2012-09-05 MED ORDER — WARFARIN SODIUM 7.5 MG PO TABS
7.5000 mg | ORAL_TABLET | Freq: Once | ORAL | Status: AC
  Administered 2012-09-05: 7.5 mg via ORAL
  Filled 2012-09-05 (×2): qty 1

## 2012-09-05 MED ORDER — HYDROCODONE-ACETAMINOPHEN 5-325 MG PO TABS
1.0000 | ORAL_TABLET | ORAL | Status: DC | PRN
  Administered 2012-09-05 – 2012-09-06 (×4): 2 via ORAL
  Administered 2012-09-06: 1 via ORAL
  Administered 2012-09-06: 2 via ORAL
  Administered 2012-09-06 – 2012-09-08 (×5): 1 via ORAL
  Filled 2012-09-05 (×2): qty 2
  Filled 2012-09-05: qty 1
  Filled 2012-09-05 (×2): qty 2
  Filled 2012-09-05 (×2): qty 1
  Filled 2012-09-05: qty 2
  Filled 2012-09-05 (×2): qty 1
  Filled 2012-09-05: qty 2
  Filled 2012-09-05: qty 1

## 2012-09-05 MED ORDER — MORPHINE SULFATE 2 MG/ML IJ SOLN
2.0000 mg | Freq: Once | INTRAMUSCULAR | Status: AC
  Administered 2012-09-05: 2 mg via INTRAVENOUS

## 2012-09-05 MED ORDER — WARFARIN - PHARMACIST DOSING INPATIENT: Freq: Every day | Status: DC

## 2012-09-05 MED ORDER — WARFARIN VIDEO
Freq: Once | Status: AC
  Administered 2012-09-05: 20:00:00

## 2012-09-05 MED ORDER — COUMADIN BOOK
Freq: Once | Status: AC
  Administered 2012-09-05: 20:00:00
  Filled 2012-09-05 (×2): qty 1

## 2012-09-05 MED ORDER — IBUPROFEN 800 MG PO TABS: 400.0000 mg | ORAL_TABLET | Freq: Four times a day (QID) | ORAL | Status: DC | PRN

## 2012-09-05 NOTE — Progress Notes (Signed)
ANTICOAGULATION CONSULT NOTE - Follow Up Consult  Pharmacy Consult for IV heparin Indication: Bilateral PEs  Please see consult note written earlier today for full details.  Assessment: 41 yoF on IV heparin for BL PEs.  Heparin level tonight = 0.31, remains near low end of therapeutic range after infusion rate was increased this afternoon.  Will again increase rate of infusion to try to maintain heparin within target 0.3 to 0.7.  No infusion issues or bleeding noted/documented.   Plan:  Increase heparin to 1350 units/hr (increase of 2 units/kg/hr).  F/u 6 hour HL.    Haynes Hoehn, PharmD 09/05/2012 5:52 PM  Pager: [phone]

## 2012-09-05 NOTE — Progress Notes (Signed)
  Echocardiogram 2D Echocardiogram has been performed.  [name], [name] 09/05/2012, 12:01 PM

## 2012-09-05 NOTE — Progress Notes (Signed)
*  PRELIMINARY RESULTS* Vascular Ultrasound Lower extremity venous duplex has been completed.  Preliminary findings: Bilateral:  No evidence of DVT, superficial thrombosis, or Baker's Cyst.    Farrel Demark, RDMS, RVT  09/05/2012, 8:40 AM

## 2012-09-05 NOTE — Progress Notes (Signed)
ANTICOAGULATION CONSULT NOTE - Follow Up Consult  Pharmacy Consult for IV heparin Indication: Bilateral PEs  No Known Allergies  Patient Measurements: Height: 5\' 4"  (162.6 cm) Weight: 145 lb 9.6 oz (66.044 kg) IBW/kg (Calculated) : 54.7 Heparin Dosing Weight: 66kg  Vital Signs: Temp: 98.7 F (37.1 C) (04/06 0618) Temp src: Oral (04/06 0618) BP: 125/68 mmHg (04/06 0618) Pulse Rate: 89 (04/06 0618)  Labs:  Recent Labs  09/04/12 1819 09/04/12 2025 09/04/12 2210 09/05/12 0114 09/05/12 1007  HGB 15.3*  --   --  13.3  --   HCT 45.0  --   --  38.1  --   PLT  --   --   --  186  --   APTT  --  146*  --   --   --   LABPROT  --  13.9  --   --   --   INR  --  1.08  --   --   --   HEPARINUNFRC  --   --   --  0.35 0.30  CREATININE 1.00  --   --   --   --   TROPONINI  --   --  <0.30  --   --     Estimated Creatinine Clearance: 68.5 ml/min (by C-G formula based on Cr of 1).  Assessment: 41 yoF on IV heparin for acute bilateral PEs.  No OAC started yet.  Noted LE doppers negative.  Heparin level this AM has decreased slightly and is now borderline therapeutic.  Will increase rate of infusion and recheck heparin level in 6 hours.  Renal ok. CBC ok.  No bleeding or infusion site issues noted.    Goal of Therapy:  Heparin level 0.3-0.7 units/ml Monitor platelets by anticoagulation protocol: Yes   Plan:  1.  Increase heparin rate to 1200 units/hr = 12 ml/hr.   2.  F/u heparin level in 6 hours. 3.  F/u [facility] plans  Haynes Hoehn, PharmD 09/05/2012 11:04 AM  Pager: [phone]

## 2012-09-05 NOTE — Progress Notes (Signed)
ANTICOAGULATION CONSULT NOTE - Follow Up Consult  Pharmacy Consult for Heparin Indication: pulmonary embolus  No Known Allergies  Patient Measurements: Height: 5\' 4"  (162.6 cm) Weight: 145 lb 9.6 oz (66.044 kg) IBW/kg (Calculated) : 54.7 Heparin Dosing Weight:   Vital Signs: Temp: 98.7 F (37.1 C) (04/06 0103) Temp src: Oral (04/05 2202) BP: 114/61 mmHg (04/06 0103) Pulse Rate: 86 (04/06 0103)  Labs:  Recent Labs  09/04/12 1819 09/04/12 2025 09/04/12 2210 09/05/12 0114  HGB 15.3*  --   --  13.3  HCT 45.0  --   --  38.1  PLT  --   --   --  186  APTT  --  146*  --   --   LABPROT  --  13.9  --   --   INR  --  1.08  --   --   HEPARINUNFRC  --   --   --  0.35  CREATININE 1.00  --   --   --   TROPONINI  --   --  <0.30  --     Estimated Creatinine Clearance: 68.5 ml/min (by C-G formula based on Cr of 1).   Medications:  Infusions:  . heparin 1,100 Units/hr (09/04/12 2001)    Assessment: Patient with heparin at goal.  No issues noted.  Goal of Therapy:  Heparin level 0.3-0.7 units/ml Monitor platelets by anticoagulation protocol: Yes   Plan:  Continue at current rate, recheck level at 1000  Darlina Guys, [place] 09/05/2012,5:53 AM

## 2012-09-05 NOTE — Progress Notes (Signed)
[facility] PROGRESS NOTE  [name] JYN:[id number] DOB: 1970-12-22 DOA: 09/04/2012 PCP: Mickie Hillier, MD  Assessment/Plan: 1. Bilateral PE'S: on IV heparin and coumadin.  - echo and venous dopplers are pending. Pain control.    Code Status: full code Family Communication: none at bedside Disposition Plan: pending     HPI/Subjective: Reports chest pain on deep breathing.   Objective: Filed Vitals:   09/04/12 2202 09/05/12 0103 09/05/12 0618 09/05/12 1327  BP: 111/72 114/61 125/68 120/79  Pulse: 100 86 89 91  Temp: 98.8 F (37.1 C) 98.7 F (37.1 C) 98.7 F (37.1 C) 98.6 F (37 C)  TempSrc: Oral  Oral Oral  Resp: 20 18 18 18   Height: 5\' 4"  (1.626 m)     Weight: 66.044 kg (145 lb 9.6 oz)     SpO2: 100% 100% 100% 98%    Intake/Output Summary (Last 24 hours) at 09/05/12 1911 Last data filed at 09/05/12 1833  Gross per 24 hour  Intake 509.53 ml  Output   1350 ml  Net -840.47 ml   Filed Weights   09/04/12 1726 09/04/12 2202  Weight: 65.772 kg (145 lb) 66.044 kg (145 lb 9.6 oz)    Exam: Alert afebrile comfortable CHEST: Normal respiration, clear to auscultation bilaterally.  HEART: Regular rate and rhythm. There are no murmur, rub, or gallops.  BACK: No kyphosis or scoliosis; no CVA tenderness  ABDOMEN: soft and non-tender; no masses, no organomegaly, normal abdominal bowel sounds; no pannus; no intertriginous candida. There is no rebound and no distention.  EXTREMITIES: No bone or joint deformity; age-appropriate arthropathy of the hands and knees; no edema; no ulcerations. There is no calf tenderness   Data Reviewed: Basic Metabolic Panel:  Recent Labs Lab 09/04/12 1819  NA 141  K 3.5  CL 102  GLUCOSE 112*  BUN 9  CREATININE 1.00   Liver Function Tests: No results found for this basename: AST, ALT, ALKPHOS, BILITOT, PROT, ALBUMIN,  in the last 168 hours No results found for this basename: LIPASE, AMYLASE,  in the last 168 hours No  results found for this basename: AMMONIA,  in the last 168 hours CBC:  Recent Labs Lab 09/04/12 1819 09/05/12 0114  WBC  --  11.0*  HGB 15.3* 13.3  HCT 45.0 38.1  MCV  --  88.4  PLT  --  186   Cardiac Enzymes:  Recent Labs Lab 09/04/12 2210  TROPONINI <0.30   BNP (last 3 results) No results found for this basename: PROBNP,  in the last 8760 hours CBG: No results found for this basename: GLUCAP,  in the last 168 hours  No results found for this or any previous visit (from the past 240 hour(s)).   Studies: Ct Angio Chest W/cm &/or Wo Cm  09/04/2012  *RADIOLOGY REPORT*  Clinical Data: Shortness of breath.  CT ANGIOGRAPHY CHEST  Technique:  Multidetector CT imaging of the chest using the standard protocol during bolus administration of intravenous contrast. Multiplanar reconstructed images including MIPs were obtained and reviewed to evaluate the vascular anatomy.  Contrast: OMNIPAQUE IOHEXOL 350 MG/ML SOLN  Comparison: None.  Findings: Lung apices not entirely included on the present exam as the patient was not able to cooperate secondary to discomfort.  Bilateral lower lobe and right middle lobe pulmonary emboli.  Right heart does not appear significantly enlarged to indicate pulmonary strain currently.  Basilar atelectatic change greater left with small left-sided pleural effusion. Pulmonary infarct not excluded.  No evidence of aortic  dissection.  Slight ectasia ascending thoracic aorta.  Radiopaque material within the left kidney may represent excreted contrast.  Kidney stone not excluded.  Mild thoracic kyphosis without bony destructive lesion.  No mediastinal or hilar adenopathy.  IMPRESSION: Bilateral lower lobe and right middle lobe pulmonary emboli.  Basilar atelectatic change greater left with small left-sided pleural effusion. Pulmonary infarct not excluded  Critical Value/emergent results were called by telephone at the time of interpretation on 09/04/2012 at 7:34 p.m. to  Dr. Radford Pax, who verbally acknowledged these results.   Original Report Authenticated By: [name], M.D.     Scheduled Meds: . docusate sodium  100 mg Oral BID  . sodium chloride  3 mL Intravenous Q12H  . sodium chloride  3 mL Intravenous Q12H   Continuous Infusions: . heparin 1,350 Units/hr (09/05/12 1756)    Principal Problem:   Pulmonary embolism Active Problems:   Contraceptive management        [facility]  [facility] Pager [phone]. If 7PM-7AM, please contact night-coverage at [email or web address], password [facility] 09/05/2012, 7:11 PM  LOS: 1 day

## 2012-09-05 NOTE — Progress Notes (Signed)
Utilization review completed.  

## 2012-09-05 NOTE — Progress Notes (Signed)
Continues to have  "spasm" pain L/side chest it remains frequent without regard to activity, VSS, ox sat on 2l/m [place] remains 100%.  2 MG MSO4 given, and pain decreases to a 3 from an 8.  Iv pain meds last about 60 minutes and then pain comes back to an 8/10.   Pt has been basically receiving 2 MG q 2 and we have been repositioning her frequently in bed, in the recliner, with heat pads etc.  Pt is tolerating PO's well.  I believe she can tolerate po pain meds, Lenny Pastel acknowledges but would like to keep her on IV for the remainder of night.  Will continue to monitor.

## 2012-09-05 NOTE — Progress Notes (Signed)
Has not needed any more pain medication since 0230.

## 2012-09-05 NOTE — Progress Notes (Addendum)
ANTICOAGULATION CONSULT NOTE - Initial Consult  Pharmacy Consult for warfarin Indication: bilateral PE's  No Known Allergies  Patient Measurements: Height: 5\' 4"  (162.6 cm) Weight: 145 lb 9.6 oz (66.044 kg) IBW/kg (Calculated) : 54.7  Vital Signs: Temp: 98.6 F (37 C) (04/06 1327) Temp src: Oral (04/06 1327) BP: 120/79 mmHg (04/06 1327) Pulse Rate: 91 (04/06 1327)  Labs:  Recent Labs  09/04/12 1819 09/04/12 2025 09/04/12 2210 09/05/12 0114 09/05/12 1007 09/05/12 1643  HGB 15.3*  --   --  13.3  --   --   HCT 45.0  --   --  38.1  --   --   PLT  --   --   --  186  --   --   APTT  --  146*  --   --   --   --   LABPROT  --  13.9  --   --   --   --   INR  --  1.08  --   --   --   --   HEPARINUNFRC  --   --   --  0.35 0.30 0.31  CREATININE 1.00  --   --   --   --   --   TROPONINI  --   --  <0.30  --   --   --     Estimated Creatinine Clearance: 68.5 ml/min (by C-G formula based on Cr of 1).   Medical History: Past Medical History  Diagnosis Date  . Pelvic relaxation   . Abnormal Pap smear 07/30/11    ASCUS  . Vulvar lesion 2012  . Vaginal discharge     recurrent  . Simple ovarian cyst 2006    left   . Post partum depression     hx of   . Hemorrhoids 2004  . Breast pain, right 2004    Medications:  Scheduled:  . docusate sodium  100 mg Oral BID  . [COMPLETED] heparin  4,000 Units Intravenous Once  . [COMPLETED]  morphine injection  2 mg Intravenous Once  . sodium chloride  3 mL Intravenous Q12H  . sodium chloride  3 mL Intravenous Q12H  . [DISCONTINUED] heparin  4,000 Units Intravenous Once   Infusions:  . heparin 1,350 Units/hr (09/05/12 1756)    Assessment:  42 yo known to pharmacy for heparin dosing  To start warfarin per pharmacy tonight  Day 1/5 overlap and then until INR >  X 24hrs per guidelines  Goal of Therapy:  INR 2-3    Plan:  1) Warfarin 7.5mg  tonight 2) Daily INR 3) Send education materials to patient and have them watch  video   Hessie Knows, PharmD, BCPS Pager [phone] 09/05/2012 7:26 PM

## 2012-09-06 LAB — CBC
HCT: 39.2 % (ref 36.0–46.0)
Hemoglobin: 13.4 g/dL (ref 12.0–15.0)
RBC: 4.36 MIL/uL (ref 3.87–5.11)
WBC: 10.2 10*3/uL (ref 4.0–10.5)

## 2012-09-06 LAB — PROTIME-INR
INR: 0.99 (ref 0.00–1.49)
Prothrombin Time: 13 seconds (ref 11.6–15.2)

## 2012-09-06 LAB — HEPARIN LEVEL (UNFRACTIONATED): Heparin Unfractionated: 0.36 IU/mL (ref 0.30–0.70)

## 2012-09-06 MED ORDER — WARFARIN SODIUM 7.5 MG PO TABS
7.5000 mg | ORAL_TABLET | Freq: Once | ORAL | Status: AC
  Administered 2012-09-06: 7.5 mg via ORAL
  Filled 2012-09-06: qty 1

## 2012-09-06 MED ORDER — POLYETHYLENE GLYCOL 3350 17 G PO PACK
17.0000 g | PACK | Freq: Every day | ORAL | Status: DC
  Administered 2012-09-06 – 2012-09-08 (×3): 17 g via ORAL
  Filled 2012-09-06 (×3): qty 1

## 2012-09-06 NOTE — Progress Notes (Signed)
ANTICOAGULATION CONSULT NOTE - Follow Up Consult  Pharmacy Consult for Heparin Indication: pulmonary embolus (bilat)  No Known Allergies  Patient Measurements: Height: 5\' 4"  (162.6 cm) Weight: 145 lb 9.6 oz (66.044 kg) IBW/kg (Calculated) : 54.7  Vital Signs: Temp: 97.8 F (36.6 C) (04/07 0500) Temp src: Oral (04/07 0500) BP: 111/69 mmHg (04/07 0500) Pulse Rate: 74 (04/07 0500)  Labs:  Recent Labs  09/04/12 1819 09/04/12 2025 09/04/12 2210 09/05/12 0114  09/05/12 1643 09/06/12 0106 09/06/12 1020  HGB 15.3*  --   --  13.3  --   --  13.4  --   HCT 45.0  --   --  38.1  --   --  39.2  --   PLT  --   --   --  186  --   --  200  --   APTT  --  146*  --   --   --   --   --   --   LABPROT  --  13.9  --   --   --   --  13.0  --   INR  --  1.08  --   --   --   --  0.99  --   HEPARINUNFRC  --   --   --  0.35  < > 0.31 0.36 0.39  CREATININE 1.00  --   --   --   --   --   --   --   TROPONINI  --   --  <0.30  --   --   --   --   --   < > = values in this interval not displayed.  Estimated Creatinine Clearance: 68.5 ml/min (by C-G formula based on Cr of 1).   Medications:  Infusions:  . heparin 1,350 Units/hr (09/06/12 5284)    Assessment: [name] with bilateral PE on day #2 overlap of IV heparin + warfarin.  HL therapeutic x 2 with infusion at 1350 units/hr  INR 0.99  CBC stable No bleeding/complications reported.  Goal of Therapy:  INR 2-3 Heparin level 0.3-0.7 units/ml Monitor platelets by anticoagulation protocol: Yes   Plan:   Continue IV heparin at 1350 units/hr.  F/u Heparin level in AM.  Warfarin 7.5 mg PO once today.  INR in AM.  Warfarin education to follow.  Clance Boll 09/06/2012,11:30 AM

## 2012-09-06 NOTE — Progress Notes (Signed)
ANTICOAGULATION CONSULT NOTE - Follow Up Consult  Pharmacy Consult for Heparin Indication: pulmonary embolus (bilat)  No Known Allergies  Patient Measurements: Height: 5\' 4"  (162.6 cm) Weight: 145 lb 9.6 oz (66.044 kg) IBW/kg (Calculated) : 54.7 Heparin Dosing Weight:   Vital Signs: Temp: 98 F (36.7 C) (04/06 2122) Temp src: Oral (04/06 2122) BP: 108/60 mmHg (04/06 2122) Pulse Rate: 74 (04/06 2122)  Labs:  Recent Labs  09/04/12 1819 09/04/12 2025 09/04/12 2210  09/05/12 0114 09/05/12 1007 09/05/12 1643 09/06/12 0106  HGB 15.3*  --   --   --  13.3  --   --  13.4  HCT 45.0  --   --   --  38.1  --   --  39.2  PLT  --   --   --   --  186  --   --  200  APTT  --  146*  --   --   --   --   --   --   LABPROT  --  13.9  --   --   --   --   --  13.0  INR  --  1.08  --   --   --   --   --  0.99  HEPARINUNFRC  --   --   --   < > 0.35 0.30 0.31 0.36  CREATININE 1.00  --   --   --   --   --   --   --   TROPONINI  --   --  <0.30  --   --   --   --   --   < > = values in this interval not displayed.  Estimated Creatinine Clearance: 68.5 ml/min (by C-G formula based on Cr of 1).   Medications:  Infusions:  . heparin 1,350 Units/hr (09/05/12 1756)    Assessment: Patient with heparin at goal.  No issues noted.  Goal of Therapy:  Heparin level 0.3-0.7 units/ml Monitor platelets by anticoagulation protocol: Yes   Plan:  Continue at current rate, recheck at 1000 to assure that level does not decrease again.  Darlina Guys, Jacquenette Shone Crowford 09/06/2012,5:37 AM

## 2012-09-06 NOTE — Progress Notes (Signed)
[facility] PROGRESS NOTE  [name] WUJ:[id number] DOB: 04/08/1971 DOA: 09/04/2012 PCP: Mickie Hillier, MD  Assessment/Plan: 1. Bilateral PE'S: on IV heparin and coumadin.  - echo DOES not show any right heart strain.  -venous dopplers negative for DVT Pain control AND fluids.    DVT prophylaxis.    Code Status: full code Family Communication: none at bedside Disposition Plan: pending     HPI/Subjective: Reports chest pain on deep breathing.   Objective: Filed Vitals:   09/05/12 1327 09/05/12 2122 09/06/12 0500 09/06/12 1410  BP: 120/79 108/60 111/69 115/68  Pulse: 91 74 74 92  Temp: 98.6 F (37 C) 98 F (36.7 C) 97.8 F (36.6 C) 98.1 F (36.7 C)  TempSrc: Oral Oral Oral Oral  Resp: 18 20 20 20   Height:      Weight:      SpO2: 98% 100% 100% 98%    Intake/Output Summary (Last 24 hours) at 09/06/12 1424 Last data filed at 09/06/12 1037  Gross per 24 hour  Intake 1692.53 ml  Output   1000 ml  Net 692.53 ml   Filed Weights   09/04/12 1726 09/04/12 2202  Weight: 65.772 kg (145 lb) 66.044 kg (145 lb 9.6 oz)    Exam: Alert afebrile comfortable CHEST: Normal respiration, clear to auscultation bilaterally.  HEART: Regular rate and rhythm. There are no murmur, rub, or gallops.  BACK: No kyphosis or scoliosis; no CVA tenderness  ABDOMEN: soft and non-tender; no masses, no organomegaly, normal abdominal bowel sounds; no pannus; no intertriginous candida. There is no rebound and no distention.  EXTREMITIES: No bone or joint deformity; age-appropriate arthropathy of the hands and knees; no edema; no ulcerations. There is no calf tenderness   Data Reviewed: Basic Metabolic Panel:  Recent Labs Lab 09/04/12 1819  NA 141  K 3.5  CL 102  GLUCOSE 112*  BUN 9  CREATININE 1.00   Liver Function Tests: No results found for this basename: AST, ALT, ALKPHOS, BILITOT, PROT, ALBUMIN,  in the last 168 hours No results found for this basename: LIPASE,  AMYLASE,  in the last 168 hours No results found for this basename: AMMONIA,  in the last 168 hours CBC:  Recent Labs Lab 09/04/12 1819 09/05/12 0114 09/06/12 0106  WBC  --  11.0* 10.2  HGB 15.3* 13.3 13.4  HCT 45.0 38.1 39.2  MCV  --  88.4 89.9  PLT  --  186 200   Cardiac Enzymes:  Recent Labs Lab 09/04/12 2210  TROPONINI <0.30   BNP (last 3 results) No results found for this basename: PROBNP,  in the last 8760 hours CBG: No results found for this basename: GLUCAP,  in the last 168 hours  No results found for this or any previous visit (from the past 240 hour(s)).   Studies: Ct Angio Chest W/cm &/or Wo Cm  09/04/2012  *RADIOLOGY REPORT*  Clinical Data: Shortness of breath.  CT ANGIOGRAPHY CHEST  Technique:  Multidetector CT imaging of the chest using the standard protocol during bolus administration of intravenous contrast. Multiplanar reconstructed images including MIPs were obtained and reviewed to evaluate the vascular anatomy.  Contrast: OMNIPAQUE IOHEXOL 350 MG/ML SOLN  Comparison: None.  Findings: Lung apices not entirely included on the present exam as the patient was not able to cooperate secondary to discomfort.  Bilateral lower lobe and right middle lobe pulmonary emboli.  Right heart does not appear significantly enlarged to indicate pulmonary strain currently.  Basilar atelectatic change greater left  with small left-sided pleural effusion. Pulmonary infarct not excluded.  No evidence of aortic dissection.  Slight ectasia ascending thoracic aorta.  Radiopaque material within the left kidney may represent excreted contrast.  Kidney stone not excluded.  Mild thoracic kyphosis without bony destructive lesion.  No mediastinal or hilar adenopathy.  IMPRESSION: Bilateral lower lobe and right middle lobe pulmonary emboli.  Basilar atelectatic change greater left with small left-sided pleural effusion. Pulmonary infarct not excluded  Critical Value/emergent results were called  by telephone at the time of interpretation on 09/04/2012 at 7:34 p.m. to Dr. Radford Pax, who verbally acknowledged these results.   Original Report Authenticated By: [name], M.D.     Scheduled Meds: . docusate sodium  100 mg Oral BID  . sodium chloride  3 mL Intravenous Q12H  . sodium chloride  3 mL Intravenous Q12H  . warfarin  7.5 mg Oral ONCE-1800  . Warfarin - Pharmacist Dosing Inpatient   Does not apply q1800   Continuous Infusions: . heparin 1,350 Units/hr (09/06/12 0921)    Principal Problem:   Pulmonary embolism Active Problems:   Contraceptive management        [facility]  [facility] Pager [phone]. If 7PM-7AM, please contact night-coverage at [email or web address], password [facility] 09/06/2012, 2:24 PM  LOS: 2 days

## 2012-09-06 NOTE — Care Management Note (Addendum)
    Page 1 of 1   09/08/2012     11:20:35 AM   CARE MANAGEMENT NOTE 09/08/2012  Patient:  [name], [name]   Account Number:  [id number]  Date Initiated:  09/06/2012  Documentation initiated by:  [facility]  Subjective/Objective Assessment:   ADMITTED W/SOB.BILATERAL PULMONARY EMBOLISM.     Action/Plan:   FROM HOME.HAS SUPPORT.JUST STARTED WORKING.   Anticipated DC Date:  09/08/2012   Anticipated DC Plan:  HOME/SELF CARE      DC Planning Services  CM consult      Choice offered to / List presented to:             Status of service:  Completed, signed off Medicare Important Message given?   (If response is "NO", the following Medicare IM given date fields will be blank) Date Medicare IM given:   Date Additional Medicare IM given:    Discharge Disposition:  HOME/SELF CARE  Per UR Regulation:  Reviewed for med. necessity/level of care/duration of stay  If discussed at Long Length of Stay Meetings, dates discussed:    Comments:  09/08/12 Jerett Odonohue RN,BSN NCM [phone] SPOKE TO PATIENT ABOUT HEALTH INSURANCE ID#,SHE HAS BEEN UNABLE TO PROVIDE HEALTH INSURANCE ID#,SAYS SHE WON'T HAVE IT UNTIL FRIDAY.RECOMMENDED THAT SHE CONTACT BILLING/ADMITTING TEL#[phone] ONCE SHE HAS ID#.[facility] TEL#[phone],THE COST OF LOVENOX 100MG  SQ QD/2 SYRINGES-$45.COUMADIN IN GENERIC-$9.INFORMED PATIENT OF COST SHE SAYS SHE CAN AFFORD IT,SHE WILL ALSO GET THE MEDS FILED @ [facility]..SHE HAS A PCP TO F/U WITH PER D/C INSTRUCTIONS.NO FURTHER D/C NEEDS.NURSE UPDATED.  09/05/12 Amarya Kuehl RN,BSN NCM [phone] PER INSURANCE VERIFIER-NO HEALTH INSURANCE.MEDICAID POTENTIAL.WILL OFFER $4 [facility] MED LIST,IF MEDS @ D/C NOT ON $4 MED LIST,CAN OFFER MATCH PROGRAM.

## 2012-09-06 NOTE — Progress Notes (Signed)
RN discontinued patient's tele since she has been in NSR since has been here, with no complaints of chest pain.

## 2012-09-07 LAB — CBC
MCHC: 33.8 g/dL (ref 30.0–36.0)
Platelets: 201 10*3/uL (ref 150–400)
RDW: 13.3 % (ref 11.5–15.5)
WBC: 8.7 10*3/uL (ref 4.0–10.5)

## 2012-09-07 LAB — PROTIME-INR
INR: 2.37 — ABNORMAL HIGH (ref 0.00–1.49)
Prothrombin Time: 24.8 seconds — ABNORMAL HIGH (ref 11.6–15.2)

## 2012-09-07 NOTE — Progress Notes (Signed)
[facility] PROGRESS NOTE  [name] WGN:[id number] DOB: September 09, 1970 DOA: 09/04/2012 PCP: Mickie Hillier, MD  Assessment/Plan: 1. Bilateral PE'S: on IV heparin and coumadin.  - echo DOES not show any right heart strain.  -venous dopplers negative for DVT - INR therapeutic.  Pain control AND fluids.  Possible discharge home tomorrow on lovenox and coumadin.    DVT prophylaxis.    Code Status: full code Family Communication: family at bedside.  Disposition Plan: tomorrow.      HPI/Subjective: Reports chest pain on deep breathing.   Objective: Filed Vitals:   09/06/12 0500 09/06/12 1410 09/06/12 2051 09/07/12 0502  BP: 111/69 115/68 109/63 98/70  Pulse: 74 92 86 68  Temp: 97.8 F (36.6 C) 98.1 F (36.7 C) 98.6 F (37 C) 97.9 F (36.6 C)  TempSrc: Oral Oral Oral Oral  Resp: 20 20 20 20   Height:      Weight:      SpO2: 100% 98% 98% 98%    Intake/Output Summary (Last 24 hours) at 09/07/12 1231 Last data filed at 09/07/12 0900  Gross per 24 hour  Intake 1083.75 ml  Output   1200 ml  Net -116.25 ml   Filed Weights   09/04/12 1726 09/04/12 2202  Weight: 65.772 kg (145 lb) 66.044 kg (145 lb 9.6 oz)    Exam: Alert afebrile comfortable CHEST: Normal respiration, clear to auscultation bilaterally.  HEART: Regular rate and rhythm. There are no murmur, rub, or gallops.  BACK: No kyphosis or scoliosis; no CVA tenderness  ABDOMEN: soft and non-tender; no masses, no organomegaly, normal abdominal bowel sounds; no pannus; no intertriginous candida. There is no rebound and no distention.  EXTREMITIES: No bone or joint deformity; age-appropriate arthropathy of the hands and knees; no edema; no ulcerations. There is no calf tenderness   Data Reviewed: Basic Metabolic Panel:  Recent Labs Lab 09/04/12 1819  NA 141  K 3.5  CL 102  GLUCOSE 112*  BUN 9  CREATININE 1.00   Liver Function Tests: No results found for this basename: AST, ALT, ALKPHOS, BILITOT,  PROT, ALBUMIN,  in the last 168 hours No results found for this basename: LIPASE, AMYLASE,  in the last 168 hours No results found for this basename: AMMONIA,  in the last 168 hours CBC:  Recent Labs Lab 09/04/12 1819 09/05/12 0114 09/06/12 0106 09/07/12 0519  WBC  --  11.0* 10.2 8.7  HGB 15.3* 13.3 13.4 12.5  HCT 45.0 38.1 39.2 37.0  MCV  --  88.4 89.9 89.2  PLT  --  186 200 201   Cardiac Enzymes:  Recent Labs Lab 09/04/12 2210  TROPONINI <0.30   BNP (last 3 results) No results found for this basename: PROBNP,  in the last 8760 hours CBG: No results found for this basename: GLUCAP,  in the last 168 hours  No results found for this or any previous visit (from the past 240 hour(s)).   Studies: No results found.  Scheduled Meds: . docusate sodium  100 mg Oral BID  . polyethylene glycol  17 g Oral Daily  . sodium chloride  3 mL Intravenous Q12H  . sodium chloride  3 mL Intravenous Q12H  . Warfarin - Pharmacist Dosing Inpatient   Does not apply q1800   Continuous Infusions: . heparin 1,350 Units/hr (09/07/12 0323)    Principal Problem:   Pulmonary embolism Active Problems:   Contraceptive management        [facility]  [facility] Pager [phone]. If 7PM-7AM, please  contact night-coverage at [email or web address], password [facility] 09/07/2012, 12:31 PM  LOS: 3 days

## 2012-09-07 NOTE — Progress Notes (Addendum)
ANTICOAGULATION CONSULT NOTE - Follow Up Consult  Pharmacy Consult for Heparin Indication: pulmonary embolus (bilat)  No Known Allergies  Patient Measurements: Height: 5\' 4"  (162.6 cm) Weight: 145 lb 9.6 oz (66.044 kg) IBW/kg (Calculated) : 54.7  Vital Signs: Temp: 97.9 F (36.6 C) (04/08 0502) Temp src: Oral (04/08 0502) BP: 98/70 mmHg (04/08 0502) Pulse Rate: 68 (04/08 0502)  Labs:  Recent Labs  09/04/12 1819 09/04/12 2025 09/04/12 2210 09/05/12 0114  09/06/12 0106 09/06/12 1020 09/07/12 0519  HGB 15.3*  --   --  13.3  --  13.4  --  12.5  HCT 45.0  --   --  38.1  --  39.2  --  37.0  PLT  --   --   --  186  --  200  --  201  APTT  --  146*  --   --   --   --   --   --   LABPROT  --  13.9  --   --   --  13.0  --  24.8*  INR  --  1.08  --   --   --  0.99  --  2.37*  HEPARINUNFRC  --   --   --  0.35  < > 0.36 0.39 0.43  CREATININE 1.00  --   --   --   --   --   --   --   TROPONINI  --   --  <0.30  --   --   --   --   --   < > = values in this interval not displayed.  Estimated Creatinine Clearance: 68.5 ml/min (by C-G formula based on Cr of 1).   Medications:  Infusions:  . heparin 1,350 Units/hr (09/07/12 0323)    Assessment: 41 YOF with bilateral PE on day #3 overlap of IV heparin + warfarin.  HL remains therapeutic with infusion at 1350 units/hr  INR 2.37 < 0.99 yesterday; very large increase in INR in response to 7.5 mg dosing x 2 days  CBC stable No bleeding/complications reported.  Goal of Therapy:  INR 2-3 Heparin level 0.3-0.7 units/ml Monitor platelets by anticoagulation protocol: Yes   Plan:   Continue IV heparin at 1350 units/hr.  F/u Heparin level in AM.  No warfarin dose today.  HOLD due to significant increase in INR.  Check INR in AM.  Warfarin education to follow.  Clance Boll 09/07/2012,11:07 AM  Addendum: 09/07/2012 11:24 AM Completed warfarin education with patient and her boyfriend  Patient demonstrated a good understanding  of the safety and use of warfarin therapy (including drug/food interactions, s/sx's bleeding, INR monitoring).  Clance Boll, PharmD, BCPS Pager: [phone] 09/07/2012 11:25 AM

## 2012-09-08 DIAGNOSIS — I2699 Other pulmonary embolism without acute cor pulmonale: Principal | ICD-10-CM

## 2012-09-08 DIAGNOSIS — Z309 Encounter for contraceptive management, unspecified: Secondary | ICD-10-CM

## 2012-09-08 LAB — PROTIME-INR: Prothrombin Time: 20 seconds — ABNORMAL HIGH (ref 11.6–15.2)

## 2012-09-08 LAB — CBC
MCH: 30.3 pg (ref 26.0–34.0)
MCHC: 34 g/dL (ref 30.0–36.0)
RDW: 13.2 % (ref 11.5–15.5)

## 2012-09-08 LAB — HEPARIN LEVEL (UNFRACTIONATED): Heparin Unfractionated: 0.42 IU/mL (ref 0.30–0.70)

## 2012-09-08 MED ORDER — ENOXAPARIN SODIUM 100 MG/ML ~~LOC~~ SOLN: 100.0000 mg | SUBCUTANEOUS | Status: DC

## 2012-09-08 MED ORDER — WARFARIN SODIUM 2.5 MG PO TABS
2.5000 mg | ORAL_TABLET | Freq: Once | ORAL | Status: AC
  Administered 2012-09-08: 2.5 mg via ORAL
  Filled 2012-09-08 (×2): qty 1

## 2012-09-08 MED ORDER — WARFARIN SODIUM 2.5 MG PO TABS: 2.5000 mg | ORAL_TABLET | Freq: Once | ORAL | Status: DC

## 2012-09-08 MED ORDER — ENOXAPARIN SODIUM 80 MG/0.8ML ~~LOC~~ SOLN
1.0000 mg/kg | Freq: Two times a day (BID) | SUBCUTANEOUS | Status: DC
  Filled 2012-09-08 (×3): qty 0.8

## 2012-09-08 MED ORDER — ENOXAPARIN SODIUM 100 MG/ML ~~LOC~~ SOLN
1.5000 mg/kg | SUBCUTANEOUS | Status: DC
  Administered 2012-09-08: 100 mg via SUBCUTANEOUS
  Filled 2012-09-08: qty 1

## 2012-09-08 NOTE — Progress Notes (Signed)
ANTICOAGULATION CONSULT NOTE - Initial Consult  Pharmacy Consult for LMWH Indication: pulmonary embolus  No Known Allergies  Patient Measurements: Height: 5\' 4"  (162.6 cm) Weight: 145 lb 9.6 oz (66.044 kg) IBW/kg (Calculated) : 54.7 Heparin Dosing Weight:   Vital Signs: Temp: 97.3 F (36.3 C) (04/09 0616) Temp src: Oral (04/09 0616) BP: 118/71 mmHg (04/09 0616) Pulse Rate: 69 (04/09 0616)  Labs:  Recent Labs  09/06/12 0106 09/06/12 1020 09/07/12 0519 09/08/12 0500  HGB 13.4  --  12.5 12.9  HCT 39.2  --  37.0 37.9  PLT 200  --  201 224  LABPROT 13.0  --  24.8* 20.0*  INR 0.99  --  2.37* 1.77*  HEPARINUNFRC 0.36 0.39 0.43 0.42    Estimated Creatinine Clearance: 68.5 ml/min (by C-G formula based on Cr of 1).   Medical History: Past Medical History  Diagnosis Date  . Pelvic relaxation   . Abnormal Pap smear 07/30/11    ASCUS  . Vulvar lesion 2012  . Vaginal discharge     recurrent  . Simple ovarian cyst 2006    left   . Post partum depression     hx of   . Hemorrhoids 2004  . Breast pain, right 2004    Assessment: 62 yoF with BL PEs to transition from IV heparin to LMWH with warfarin due to anticipated discharge today.  Please see consult note written earlier today for full details.  Weight = 66kg.  Renal function ok (CrCl ~ 69 ml/min).  CBC stable.  RN to turn off IV heparin now, will schedule first dose LMWH in 1 hour.    Goal of Therapy:  Anti-Xa level 0.6-1.2 units/ml 4hrs after LMWH dose given Monitor platelets by anticoagulation protocol: Yes   Plan:  1.  Lovenox 1mg /kg q 12 hours 2.  Already ordered coumadin 2.5mg  - will change to be given at noon.   3.  Pt will need INR checks at home in the next 1 -2 days.    Haynes Hoehn, PharmD 09/08/2012 8:35 AM  Pager: [phone]

## 2012-09-08 NOTE — Discharge Summary (Addendum)
[name]                                                                                   [name], is a 42 y.o. female  DOB 04-29-1971  MRN [id number].  Admission date:  09/04/2012  Discharge Date:  09/08/2012  Primary MD  Mickie Hillier, MD  Admitting Physician  Houston Siren, MD  Admission Diagnosis  Pulmonary embolism [415.19] Contraceptive management [V25.9]  Discharge Diagnosis     Principal Problem:   Pulmonary embolism Active Problems:   Contraceptive management    Past Medical History  Diagnosis Date  . Pelvic relaxation   . Abnormal Pap smear 07/30/11    ASCUS  . Vulvar lesion 2012  . Vaginal discharge     recurrent  . Simple ovarian cyst 2006    left   . Post partum depression     hx of   . Hemorrhoids 2004  . Breast pain, right 2004    Past Surgical History  Procedure Laterality Date  . 2008 vaginal cyst removal    . Wisdom tooth extraction    . Bartholin cyst marsupialization      right gland     Recommendations for primary care physician for things to follow:   Monitor INR closely, stop Lovenox once INR is therapeutic she is already received 4 days of overlap. Repeat 2 view chest x-ray in a month.   Discharge Diagnoses:   Principal Problem:   Pulmonary embolism Active Problems:   Contraceptive management    Discharge Condition: Stable   Diet recommendation: See Discharge Instructions below   Consults None    History of present illness and  Hospital Course:     Kindly see H&P for history of present illness and admission details, please review complete Labs, Consult reports and Test reports for all details in brief [name], is a 42 y.o. female, patient was admitted for bilateral lower extremity pain due to acute bilateral PEs, she was using oral contraceptive pills and had recently traveled long distance, she was kept on IV heparin drip along with Coumadin, she is now being transitioned to Lovenox and  Coumadin, today will be day 4 of her overlap, she will follow closely with her PCP for INR monitoring, Coumadin Lovenox education provided, her lower extremity venous duplex is unremarkable. She has been requested not to use oral contraceptive pills in the future, one time outpatient followup with hematology post discharge is recommended.   Please repeat 2 view chest x-ray in 4 weeks to document stability and resolution of pleural effusion.         Today   Subjective:   [name] today has no headache,no chest abdominal pain,no new weakness tingling or numbness, feels much better wants to go home today.    Objective:   Blood pressure 118/71, pulse 69, temperature 97.3 F (36.3 C), temperature source Oral, resp. rate 18, height 5\' 4"  (1.626 m), weight 66.044 kg (145 lb 9.6 oz), last menstrual period 08/23/2012, SpO2 98.00%.   Intake/Output Summary (Last 24 hours) at 09/08/12 1213 Last data filed at 09/08/12 0945  Gross per 24 hour  Intake 1371.63  ml  Output      0 ml  Net 1371.63 ml    Exam Awake Alert, Oriented *3, No new F.N deficits, Normal affect [place].AT,PERRAL Supple Neck,No JVD, No cervical lymphadenopathy appriciated.  Symmetrical Chest wall movement, Good air movement bilaterally, CTAB RRR,No Gallops,Rubs or new Murmurs, No Parasternal Heave +ve B.Sounds, Abd Soft, Non tender, No organomegaly appriciated, No rebound -guarding or rigidity. No Cyanosis, Clubbing or edema, No new Rash or bruise  Data Review   Major procedures and Radiology Reports - PLEASE review detailed and final reports for all details in brief -     BiLateral lower extremity venous duplex no clots   Ct Angio Chest W/cm &/or Wo Cm  09/04/2012  *RADIOLOGY REPORT*  Clinical Data: Shortness of breath.  CT ANGIOGRAPHY CHEST  Technique:  Multidetector CT imaging of the chest using the standard protocol during bolus administration of intravenous contrast. Multiplanar reconstructed images including  MIPs were obtained and reviewed to evaluate the vascular anatomy.  Contrast: OMNIPAQUE IOHEXOL 350 MG/ML SOLN  Comparison: None.  Findings: Lung apices not entirely included on the present exam as the patient was not able to cooperate secondary to discomfort.  Bilateral lower lobe and right middle lobe pulmonary emboli.  Right heart does not appear significantly enlarged to indicate pulmonary strain currently.  Basilar atelectatic change greater left with small left-sided pleural effusion. Pulmonary infarct not excluded.  No evidence of aortic dissection.  Slight ectasia ascending thoracic aorta.  Radiopaque material within the left kidney may represent excreted contrast.  Kidney stone not excluded.  Mild thoracic kyphosis without bony destructive lesion.  No mediastinal or hilar adenopathy.  IMPRESSION: Bilateral lower lobe and right middle lobe pulmonary emboli.  Basilar atelectatic change greater left with small left-sided pleural effusion. Pulmonary infarct not excluded  Critical Value/emergent results were called by telephone at the time of interpretation on 09/04/2012 at 7:34 p.m. to Dr. Radford Pax, who verbally acknowledged these results.   Original Report Authenticated By: [name], M.D.     Micro Results      No results found for this or any previous visit (from the past 240 hour(s)).  Lab Results  Component Value Date   INR 1.77* 09/08/2012   INR 2.37* 09/07/2012   INR 0.99 09/06/2012     CBC w Diff: Lab Results  Component Value Date   WBC 5.9 09/08/2012   HGB 12.9 09/08/2012   HCT 37.9 09/08/2012   PLT 224 09/08/2012   LYMPHOPCT 6* 12/14/2006   MONOPCT 3 12/14/2006   EOSPCT 0 12/14/2006   BASOPCT 0 12/14/2006    CMP: Lab Results  Component Value Date   NA 141 09/04/2012   K 3.5 09/04/2012   CL 102 09/04/2012   BUN 9 09/04/2012   CREATININE 1.00 09/04/2012  .   Discharge Instructions     [name] was admitted to the Hospital on 09/04/2012 and Discharged  09/08/2012 and should be excused  from work/school   for 5 days starting 09/04/2012 , may return to work/school without any restrictions.  Call Susa Raring MD, [facility] [phone] with questions.  Leroy Sea M.D on 09/08/2012,at 12:12 PM  [facility] Office  [phone]     Do not use any further contraceptives.   Follow with Primary MD Mickie Hillier, MD in 2 days   Get CBC, CMP, INR checked 2 days by Primary MD and again as instructed by your Primary MD. Get a 2 view Chest X ray done next  visit if you had Pneumonia of Lung problems at the Hospital.  Get Medicines reviewed and adjusted.  Please request your Prim.MD to go over all Hospital Tests and Procedure/Radiological results at the follow up, please get all Hospital records sent to your Prim MD by signing hospital release before you go home.  Activity: As tolerated with Full fall precautions use walker/cane & assistance as needed   Diet:  Heart healthy  For Heart failure patients - Check your Weight same time everyday, if you gain over 2 pounds, or you develop in leg swelling, experience more shortness of breath or chest pain, call your Primary MD immediately. Follow Cardiac Low Salt Diet and 1.8 lit/day fluid restriction.  Disposition Home   If you experience worsening of your admission symptoms, develop shortness of breath, life threatening emergency, suicidal or homicidal thoughts you must seek medical attention immediately by calling 911 or calling your MD immediately  if symptoms less severe.  You Must read complete instructions/literature along with all the possible adverse reactions/side effects for all the Medicines you take and that have been prescribed to you. Take any new Medicines after you have completely understood and accpet all the possible adverse reactions/side effects.   Do not drive and provide baby sitting services if your were admitted for syncope or siezures until you have seen by Primary MD or a  Neurologist and advised to do so again.  Do not drive when taking Pain medications.    Do not take more than prescribed Pain, Sleep and Anxiety Medications  Special Instructions: If you have smoked or chewed Tobacco  in the last 2 yrs please stop smoking, stop any regular Alcohol  and or any Recreational drug use.  Wear Seat belts while driving.    Follow-up Information   Follow up with Mickie Hillier, MD In 2 days. (get INR checked)    Contact information:   [address] [place] [address] [phone]       Follow up with Levert Feinstein, MD. Schedule an appointment as soon as possible for a visit in 1 month. (for blood clot treatment plan and workup)    Contact information:   [address] [place] [address] [phone]         Discharge Medications     Medication List    STOP taking these medications       norethindrone-ethinyl estradiol 1-20 MG-MCG tablet  Commonly known as:  JUNEL FE,GILDESS FE,LOESTRIN FE      TAKE these medications       enoxaparin 100 MG/ML injection  Commonly known as:  LOVENOX  Inject 1 mL (100 mg total) into the skin daily.     warfarin 2.5 MG tablet  Commonly known as:  COUMADIN  Take 1 tablet (2.5 mg total) by mouth one time only at 6 PM.           Total Time in preparing paper work, data evaluation and todays exam - 35 minutes  Leroy Sea M.D on 09/08/2012 at 12:13 PM  [facility] Office  [phone]

## 2012-09-08 NOTE — Progress Notes (Signed)
Pt discharged to home. DC instructions given with sister at bedside. No concerns voiced. Prescriptions x 2 given for lovenox and coumadin. Pt voiced concerns about not being able to go back to work until Monday. MD notified. Gave order to add 2 more days to  length of days that pt can have off  written on discharge instructions. Same was done. Pt left unit in wheelchair pushed by nurse tech accompanied by sister. Left in good condition. Vwilliams,rn.

## 2012-09-08 NOTE — Progress Notes (Signed)
ANTICOAGULATION CONSULT NOTE - Follow Up Consult  Pharmacy Consult for Heparin/Warfarin Indication: pulmonary embolus (bilateral)  No Known Allergies  Patient Measurements: Height: 5\' 4"  (162.6 cm) Weight: 145 lb 9.6 oz (66.044 kg) IBW/kg (Calculated) : 54.7  Vital Signs: Temp: 97.3 F (36.3 C) (04/09 0616) Temp src: Oral (04/09 0616) BP: 118/71 mmHg (04/09 0616) Pulse Rate: 69 (04/09 0616)  Labs:  Recent Labs  09/06/12 0106 09/06/12 1020 09/07/12 0519 09/08/12 0500  HGB 13.4  --  12.5 12.9  HCT 39.2  --  37.0 37.9  PLT 200  --  201 224  LABPROT 13.0  --  24.8* 20.0*  INR 0.99  --  2.37* 1.77*  HEPARINUNFRC 0.36 0.39 0.43 0.42    Estimated Creatinine Clearance: 68.5 ml/min (by C-G formula based on Cr of 1).   Medications:  Infusions:  . heparin 1,350 Units/hr (09/07/12 2102)    Assessment: 41 YOF with bilateral PE on day #4 overlap of IV heparin + warfarin.  HL remains therapeutic with infusion at 1350 units/hr  INR fell 2.37-->1.77.  Pt had very quick response to coumadin after 2 doses, therefore dose was held last night.  Will resume coumadin at much lower dose tonight.  No bleeding/complications reported.  Goal of Therapy:  INR 2-3 Heparin level 0.3-0.7 units/ml Monitor platelets by anticoagulation protocol: Yes   Plan:   Continue IV heparin at 1350 units/hr.  F/u Heparin level in AM.  Coumadin 2.5mg  po x 1 tonight.  F/u daily PT/INR, CBC  Haynes Hoehn, PharmD 09/08/2012 7:38 AM  Pager: [phone]

## 2012-09-08 NOTE — Progress Notes (Signed)
lovenox and coumadin education provided. Pt voices understanding. Information/printouts  provided also.

## 2012-09-10 ENCOUNTER — Other Ambulatory Visit: Payer: Self-pay | Admitting: Obstetrics and Gynecology

## 2012-09-10 ENCOUNTER — Telehealth: Payer: Self-pay | Admitting: Oncology

## 2012-09-10 NOTE — Telephone Encounter (Signed)
S/W PT IN RE NP APPT 5/07 @ 3 W/DR. GRANFORTUNA. REFERRING DR. BARNES DX- PE ON BCP WELCOME PACKET MAILED.

## 2012-09-13 ENCOUNTER — Other Ambulatory Visit: Payer: Self-pay | Admitting: Family Medicine

## 2012-09-13 DIAGNOSIS — R928 Other abnormal and inconclusive findings on diagnostic imaging of breast: Secondary | ICD-10-CM

## 2012-09-14 ENCOUNTER — Telehealth: Payer: Self-pay | Admitting: Oncology

## 2012-09-14 NOTE — Telephone Encounter (Signed)
C/D 09/14/12 for appt. 10/06/12

## 2012-09-15 ENCOUNTER — Other Ambulatory Visit: Payer: Self-pay | Admitting: Oncology

## 2012-09-15 DIAGNOSIS — I2699 Other pulmonary embolism without acute cor pulmonale: Secondary | ICD-10-CM

## 2012-09-16 ENCOUNTER — Telehealth: Payer: Self-pay | Admitting: Oncology

## 2012-09-23 ENCOUNTER — Telehealth: Payer: Self-pay | Admitting: Oncology

## 2012-09-23 NOTE — Telephone Encounter (Signed)
S/W PT IN RE TO LAB APPT CHANGE TO 04/28 @ 8:30

## 2012-09-24 ENCOUNTER — Ambulatory Visit
Admission: RE | Admit: 2012-09-24 | Discharge: 2012-09-24 | Disposition: A | Discharge status: Home / Self Care | Payer: BC Managed Care – PPO | Source: Ambulatory Visit | Attending: Family Medicine | Admitting: Family Medicine

## 2012-09-24 DIAGNOSIS — R928 Other abnormal and inconclusive findings on diagnostic imaging of breast: Secondary | ICD-10-CM

## 2012-09-27 ENCOUNTER — Other Ambulatory Visit (HOSPITAL_BASED_OUTPATIENT_CLINIC_OR_DEPARTMENT_OTHER): Payer: BC Managed Care – PPO | Admitting: Lab

## 2012-09-27 DIAGNOSIS — I2699 Other pulmonary embolism without acute cor pulmonale: Secondary | ICD-10-CM

## 2012-09-27 LAB — CBC & DIFF AND RETIC
Basophils Absolute: 0 10*3/uL (ref 0.0–0.1)
EOS%: 0.7 % (ref 0.0–7.0)
Eosinophils Absolute: 0 10*3/uL (ref 0.0–0.5)
HGB: 14.3 g/dL (ref 11.6–15.9)
LYMPH%: 26.7 % (ref 14.0–49.7)
MCH: 30 pg (ref 25.1–34.0)
MCV: 87.4 fL (ref 79.5–101.0)
MONO%: 8.6 % (ref 0.0–14.0)
NEUT#: 3.4 10*3/uL (ref 1.5–6.5)
Platelets: 217 10*3/uL (ref 145–400)
RBC: 4.77 10*6/uL (ref 3.70–5.45)
RDW: 13.2 % (ref 11.2–14.5)
Retic %: 1.16 % (ref 0.70–2.10)

## 2012-09-27 LAB — MORPHOLOGY: PLT EST: ADEQUATE

## 2012-09-29 LAB — LUPUS ANTICOAGULANT PANEL
DRVVT: 36 secs (ref <42.9)
Lupus Anticoagulant: NOT DETECTED
PTT Lupus Anticoagulant: 32.8 secs (ref 28.0–43.0)

## 2012-09-29 LAB — ANTITHROMBIN III: AntiThromb III Func: 114 % (ref 76–126)

## 2012-09-29 LAB — CARDIOLIPIN ANTIBODIES, IGG, IGM, IGA: Anticardiolipin IgA: 5 APL U/mL (ref <22)

## 2012-09-29 LAB — BETA-2 GLYCOPROTEIN ANTIBODIES: Beta-2-Glycoprotein I IgA: 0 A Units (ref <20)

## 2012-09-29 LAB — FACTOR 5 LEIDEN

## 2012-10-06 ENCOUNTER — Other Ambulatory Visit: Payer: BC Managed Care – PPO | Admitting: Lab

## 2012-10-06 ENCOUNTER — Ambulatory Visit: Payer: BC Managed Care – PPO

## 2012-10-06 ENCOUNTER — Encounter: Payer: Self-pay | Admitting: Oncology

## 2012-10-06 ENCOUNTER — Ambulatory Visit (HOSPITAL_BASED_OUTPATIENT_CLINIC_OR_DEPARTMENT_OTHER): Payer: BC Managed Care – PPO | Admitting: Oncology

## 2012-10-06 ENCOUNTER — Telehealth: Payer: Self-pay | Admitting: Oncology

## 2012-10-06 VITALS — BP 113/72 | HR 76 | Temp 98.1°F | Resp 19 | Ht 64.0 in | Wt 143.6 lb

## 2012-10-06 DIAGNOSIS — I2699 Other pulmonary embolism without acute cor pulmonale: Secondary | ICD-10-CM

## 2012-10-06 DIAGNOSIS — Z7901 Long term (current) use of anticoagulants: Secondary | ICD-10-CM

## 2012-10-06 NOTE — Progress Notes (Signed)
Checked in new patient for dr visit. No financial issues. °

## 2012-10-06 NOTE — Progress Notes (Signed)
New Patient Hematology-Oncology Evaluation   [name] [id number] December 08, 1970 42 y.o. 10/06/2012  CC: Dr. Camillo Flaming   Reason for referral: Advise on anticoagulation in this lady who recently had a pulmonary embolus.   HPI: New patient evaluation for this pleasant 42 year old woman who has been an overall excellent health without any major medical or surgical illness. She was using a Mirena IUD for about 5 years to control dysfunctional uterine bleeding. She changed to an oral estrogen-based contraceptive about a year ago (Junel). She woke up on the morning of 09/04/2012 with unexplained left lower chest soreness with an associated pleuritic component. Pain got worse over the course of the day and she reported to the emergency department. A CT angiogram of the chest was done and showed bilateral lower lobe pulmonary emboli and right middle lobe emboli. She was hospitalized for about 4 days and treated with unfractionated heparin and then started on Coumadin. Venous ultrasound of her lower extremities showed no source of emboli. (April 6). A transthoracic echocardiogram was normal. Views of the IVC were normal. CBC was normal.  She has no other obvious risk factors for clotting. No signs or symptoms of a collagen vascular disorder. She is a nonsmoker. She did take a five-hour car drive to [place] one week before her symptoms.  In anticipation of her visit with me today I ordered a number of studies. She tested negative for the factor V Leiden and prothrombin gene mutations, negative anticardiolipin and antibody beta-2 glycoprotein 1 antibodies, negative lupus anticoagulant, normal antithrombin level.  There is no family history of blood clots. Her mother had a stroke at age 79. She has 3 brothers and 2 sisters who have not had any clotting problems.   PMH: Past Medical History  Diagnosis Date  . Pelvic relaxation   . Abnormal Pap smear 07/30/11    ASCUS  . Vulvar lesion 2012  . Vaginal  discharge     recurrent  . Simple ovarian cyst 2006    left   . Post partum depression     hx of   . Hemorrhoids 2004  . Breast pain, right 2004  No history of diabetes, ulcers, hypertension, hepatitis, yellow jaundice. She did have mononucleosis in high school. No kidney disease, thyroid disease, no history of seizure, stroke. No arthritis condition.  Past Surgical History  Procedure Laterality Date  . 2008 vaginal cyst removal    . Wisdom tooth extraction    . Bartholin cyst marsupialization      right gland    Allergies: No Known Allergies  Medications: Current Coumadin dose 5 mg Tuesdays and Thursdays 2.5 mg other days of the week. No other meds.  Social History: She is divorced. She is a Engineer, water. Nonsmoker. She has 3 children a son 23, a son 29, and a daughter 105 all of whom are healthy.  She has never used smokeless tobacco; she drinks about 0.5 ounces of alcohol per week.; she does not use illicit drugs.  Family History: Family History  Problem Relation Age of Onset  . Diabetes Mother     type 2  . Hypertension Mother   . Stroke Mother   . Hypertension Father   . Cancer Maternal Aunt     lung  . Cancer Paternal Uncle     lung  . Heart disease Paternal Grandfather     Review of Systems: Constitutional symptoms: No constitutional symptoms HEENT: No sore throat Respiratory: Resolved chest discomfort. Still getting some musculoskeletal discomfort  right lower back Cardiovascular:  No chest pain or palpitations Gastrointestinal ROS: No change in bowel habit. She has never had a colonoscopy. Genito-Urinary ROS: Heavy periods when not using a hormone. Hematological and Lymphatic: No swollen glands Musculoskeletal: No significant muscle bone or joint pain Neurologic: No headache or change in vision Dermatologic: No rash or ecchymosis Recent mammogram showed a abnormality in the left breast. She was called back for additional views and ultrasound and likely  has a benign cyst. 6 month interval followup recommended by radiology. Remaining ROS negative.  Physical Exam: Blood pressure 113/72, pulse 76, temperature 98.1 F (36.7 C), temperature source Oral, resp. rate 19, height 5\' 4"  (1.626 m), weight 143 lb 9.6 oz (65.137 kg). Wt Readings from Last 3 Encounters:  10/06/12 143 lb 9.6 oz (65.137 kg)  09/04/12 145 lb 9.6 oz (66.044 kg)  01/05/12 142 lb (64.411 kg)    General appearance: Well-nourished Caucasian woman HENNT: Pharynx no erythema or exudate Lymph nodes: No lymphadenopathy Breasts: Not examined Lungs: Clear to auscultation resonant to percussion Heart: Regular rhythm no murmur gallop or rub, no click Vascular: No cyanosis. Carotids 2+ no but is Abdominal: Soft, nontender, no mass, no organomegaly GU: Extremities: No edema, no calf tenderness, Neurologic: Mental status intact, PERRLA, I could not get a good look at the optic discs. Motor strength 5 over 5, reflexes 1+ symmetric, sensation intact to vibration over the fingertips Skin: No rash or ecchymosis    Lab Results: Lab Results: White count differential 64% neutrophils, 27% lymphocytes, 9 monocytes   Component Value Date   WBC 5.4 09/27/2012   HGB 14.3 09/27/2012   HCT 41.7 09/27/2012   MCV 87.4 09/27/2012   PLT 217 09/27/2012     Chemistry      Component Value Date/Time   NA 141 09/04/2012 1819   K 3.5 09/04/2012 1819   CL 102 09/04/2012 1819   BUN 9 09/04/2012 1819   CREATININE 1.00 09/04/2012 1819   No results found for this basename: CALCIUM, ALKPHOS, AST, ALT, BILITOT       Radiological Studies: See discussion above     Impression and Plan: Unprovoked bilateral, low volume, pulmonary emboli in an otherwise healthy woman whose only obvious risk factor for clotting appears to be an edge using containing oral contraceptive started one month prior to the thrombotic event. She is negative for both factor. V Leiden and prothrombin gene mutations which are the most  likely genetic abnormalities to potentiate clotting in people on estrogens. We can't test for protein S. and C. at present since she is on Coumadin.  Recommendation: I believe that 6 months of anticoagulation is adequate in this lady. That will take her to October. I would then stop and have her come back here for protein S and C. studies off Coumadin for 3 weeks to make sure she doesn't have one of these rare congenital defects. We had a lengthy discussion about the risk versus benefit of the new anticoagulants such as Xarelto or Pradaxa compared with Coumadin. Since we are only looking at short term anticoagulant use for now I would favor keeping her on the Coumadin.      Levert Feinstein, MD 10/06/2012, 6:13 PM

## 2012-10-06 NOTE — Telephone Encounter (Signed)
gv and printed appt sched and avs for Oct and NOV

## 2013-02-11 ENCOUNTER — Other Ambulatory Visit: Payer: Self-pay | Admitting: Family Medicine

## 2013-02-11 DIAGNOSIS — N6320 Unspecified lump in the left breast, unspecified quadrant: Secondary | ICD-10-CM

## 2013-03-25 ENCOUNTER — Ambulatory Visit
Admission: RE | Admit: 2013-03-25 | Discharge: 2013-03-25 | Disposition: A | Discharge status: Home / Self Care | Payer: BC Managed Care – PPO | Source: Ambulatory Visit | Attending: Family Medicine | Admitting: Family Medicine

## 2013-03-25 DIAGNOSIS — N6320 Unspecified lump in the left breast, unspecified quadrant: Secondary | ICD-10-CM

## 2013-03-28 ENCOUNTER — Other Ambulatory Visit: Payer: BC Managed Care – PPO

## 2013-03-29 ENCOUNTER — Other Ambulatory Visit (HOSPITAL_BASED_OUTPATIENT_CLINIC_OR_DEPARTMENT_OTHER): Payer: BC Managed Care – PPO

## 2013-03-29 ENCOUNTER — Encounter (INDEPENDENT_AMBULATORY_CARE_PROVIDER_SITE_OTHER): Payer: Self-pay

## 2013-03-29 DIAGNOSIS — I2699 Other pulmonary embolism without acute cor pulmonale: Secondary | ICD-10-CM

## 2013-04-04 LAB — PROTEIN S, TOTAL: Protein S Total: 92 % (ref 60–150)

## 2013-04-04 LAB — PROTEIN S, ANTIGEN, FREE: Protein S Ag, Free: 108 % normal (ref 50–147)

## 2013-04-04 LAB — D-DIMER, QUANTITATIVE: D-Dimer, Quant: 0.27 ug/mL-FEU (ref 0.00–0.48)

## 2013-04-05 ENCOUNTER — Ambulatory Visit (HOSPITAL_BASED_OUTPATIENT_CLINIC_OR_DEPARTMENT_OTHER): Payer: BC Managed Care – PPO | Admitting: Oncology

## 2013-04-05 VITALS — BP 106/73 | HR 99 | Temp 98.6°F | Resp 18 | Ht 64.0 in | Wt 149.0 lb

## 2013-04-05 DIAGNOSIS — I2699 Other pulmonary embolism without acute cor pulmonale: Secondary | ICD-10-CM

## 2013-04-05 NOTE — Progress Notes (Signed)
Followup visit for this pleasant 42 year old woman who sustained  unprovoked, low volume, bilateral pulmonary emboli in April of this year with only obvious risk factor being an estrogen containing oral contraceptive which she had been taking for about a year. Hypercoagulation evaluation unremarkable. No family history of clotting. Normal physical exam. Normal CBC.  I recommended 6 months of Coumadin anticoagulation and then, to complete her evaluation, checking protein S and protein C when she was off Coumadin . This was done in anticipation of today's visit on 03/29/2013 and  reveals normal total protein C 90% control (72-160), normal protein S activity 107% (69-129), normal free protein S antigen 108%) 50-147). D-dimer normal at 0.27  At this point I'm going to attribute her PE to the estrogen containing oral contraceptive. She is finished having her family so pregnancy will not be an issue going into the future. A Mirena ring or the IUD would be the optimal contraception for at a low dose of progesterone would be acceptable. She should get routine prophylactic anticoagulation or on any surgery not any different than the average person. She should take proper precautions when she takes long plane rides. I'm recommending low-dose aspirin 81 mg daily which does provide some protection against recurrent venous thrombosis of approximately 30%. I will be happy to see her again in the future if the need arises. I did not schedule a formal followup visit.

## 2013-07-30 ENCOUNTER — Encounter: Payer: Self-pay | Admitting: Oncology

## 2013-08-16 ENCOUNTER — Other Ambulatory Visit: Payer: Self-pay | Admitting: Family Medicine

## 2013-08-16 DIAGNOSIS — N6320 Unspecified lump in the left breast, unspecified quadrant: Secondary | ICD-10-CM

## 2013-09-07 ENCOUNTER — Ambulatory Visit
Admission: RE | Admit: 2013-09-07 | Discharge: 2013-09-07 | Disposition: A | Discharge status: Home / Self Care | Payer: BC Managed Care – PPO | Source: Ambulatory Visit | Attending: Family Medicine | Admitting: Family Medicine

## 2013-09-07 DIAGNOSIS — N6320 Unspecified lump in the left breast, unspecified quadrant: Secondary | ICD-10-CM

## 2014-04-03 ENCOUNTER — Encounter (HOSPITAL_COMMUNITY): Payer: Self-pay | Admitting: Emergency Medicine

## 2014-05-17 ENCOUNTER — Ambulatory Visit
Admission: RE | Admit: 2014-05-17 | Discharge: 2014-05-17 | Disposition: A | Discharge status: Home / Self Care | Payer: BC Managed Care – PPO | Source: Ambulatory Visit | Attending: Family Medicine | Admitting: Family Medicine

## 2014-05-17 ENCOUNTER — Other Ambulatory Visit: Payer: Self-pay | Admitting: Family Medicine

## 2014-05-17 ENCOUNTER — Encounter (INDEPENDENT_AMBULATORY_CARE_PROVIDER_SITE_OTHER): Payer: Self-pay

## 2014-05-17 DIAGNOSIS — N644 Mastodynia: Secondary | ICD-10-CM

## 2014-05-17 DIAGNOSIS — N6489 Other specified disorders of breast: Secondary | ICD-10-CM

## 2014-05-22 ENCOUNTER — Other Ambulatory Visit: Payer: BC Managed Care – PPO

## 2014-08-15 ENCOUNTER — Other Ambulatory Visit: Payer: Self-pay | Admitting: Family Medicine

## 2014-08-15 DIAGNOSIS — N63 Unspecified lump in unspecified breast: Secondary | ICD-10-CM

## 2014-09-12 ENCOUNTER — Ambulatory Visit
Admission: RE | Admit: 2014-09-12 | Discharge: 2014-09-12 | Disposition: A | Discharge status: Home / Self Care | Payer: Self-pay | Source: Ambulatory Visit | Attending: Family Medicine | Admitting: Family Medicine

## 2014-09-12 DIAGNOSIS — N63 Unspecified lump in unspecified breast: Secondary | ICD-10-CM

## 2015-08-28 ENCOUNTER — Other Ambulatory Visit: Payer: Self-pay

## 2015-08-28 DIAGNOSIS — Z1231 Encounter for screening mammogram for malignant neoplasm of breast: Secondary | ICD-10-CM

## 2015-09-27 ENCOUNTER — Ambulatory Visit
Admission: RE | Admit: 2015-09-27 | Discharge: 2015-09-27 | Disposition: A | Discharge status: Home / Self Care | Payer: Managed Care, Other (non HMO) | Source: Ambulatory Visit

## 2015-09-27 DIAGNOSIS — Z1231 Encounter for screening mammogram for malignant neoplasm of breast: Secondary | ICD-10-CM

## 2016-08-28 ENCOUNTER — Other Ambulatory Visit: Payer: Self-pay | Admitting: Obstetrics and Gynecology

## 2016-08-28 DIAGNOSIS — Z1231 Encounter for screening mammogram for malignant neoplasm of breast: Secondary | ICD-10-CM

## 2016-09-29 ENCOUNTER — Ambulatory Visit
Admission: RE | Admit: 2016-09-29 | Discharge: 2016-09-29 | Disposition: A | Discharge status: Home / Self Care | Payer: Managed Care, Other (non HMO) | Source: Ambulatory Visit | Attending: Obstetrics and Gynecology | Admitting: Obstetrics and Gynecology

## 2016-09-29 DIAGNOSIS — Z1231 Encounter for screening mammogram for malignant neoplasm of breast: Secondary | ICD-10-CM

## 2017-08-26 ENCOUNTER — Other Ambulatory Visit: Payer: Self-pay | Admitting: Obstetrics and Gynecology

## 2017-08-26 DIAGNOSIS — Z139 Encounter for screening, unspecified: Secondary | ICD-10-CM

## 2017-10-05 ENCOUNTER — Ambulatory Visit: Payer: Managed Care, Other (non HMO)

## 2017-10-16 ENCOUNTER — Ambulatory Visit: Payer: Managed Care, Other (non HMO)

## 2017-11-09 ENCOUNTER — Ambulatory Visit
Admission: RE | Admit: 2017-11-09 | Discharge: 2017-11-09 | Disposition: A | Discharge status: Home / Self Care | Payer: Managed Care, Other (non HMO) | Source: Ambulatory Visit | Attending: Obstetrics and Gynecology | Admitting: Obstetrics and Gynecology

## 2017-11-09 DIAGNOSIS — Z139 Encounter for screening, unspecified: Secondary | ICD-10-CM

## 2018-11-03 ENCOUNTER — Other Ambulatory Visit: Payer: Self-pay | Admitting: Family Medicine

## 2018-11-03 DIAGNOSIS — Z1231 Encounter for screening mammogram for malignant neoplasm of breast: Secondary | ICD-10-CM

## 2018-12-24 ENCOUNTER — Other Ambulatory Visit: Payer: Self-pay

## 2018-12-24 ENCOUNTER — Ambulatory Visit
Admission: RE | Admit: 2018-12-24 | Discharge: 2018-12-24 | Disposition: A | Discharge status: Home / Self Care | Payer: Managed Care, Other (non HMO) | Source: Ambulatory Visit | Attending: Family Medicine | Admitting: Family Medicine

## 2018-12-24 DIAGNOSIS — Z1231 Encounter for screening mammogram for malignant neoplasm of breast: Secondary | ICD-10-CM

## 2019-11-22 ENCOUNTER — Other Ambulatory Visit: Payer: Self-pay | Admitting: Family Medicine

## 2019-11-22 DIAGNOSIS — Z1231 Encounter for screening mammogram for malignant neoplasm of breast: Secondary | ICD-10-CM

## 2019-12-27 ENCOUNTER — Other Ambulatory Visit: Payer: Self-pay

## 2019-12-27 ENCOUNTER — Ambulatory Visit
Admission: RE | Admit: 2019-12-27 | Discharge: 2019-12-27 | Disposition: A | Discharge status: Home / Self Care | Payer: Commercial Managed Care - PPO | Source: Ambulatory Visit | Attending: Family Medicine | Admitting: Family Medicine

## 2019-12-27 DIAGNOSIS — Z1231 Encounter for screening mammogram for malignant neoplasm of breast: Secondary | ICD-10-CM

## 2020-01-12 ENCOUNTER — Other Ambulatory Visit: Payer: Self-pay | Admitting: Family Medicine

## 2020-01-12 DIAGNOSIS — Z01411 Encounter for gynecological examination (general) (routine) with abnormal findings: Secondary | ICD-10-CM

## 2020-01-18 ENCOUNTER — Ambulatory Visit
Admission: RE | Admit: 2020-01-18 | Discharge: 2020-01-18 | Disposition: A | Discharge status: Home / Self Care | Payer: Commercial Managed Care - PPO | Source: Ambulatory Visit | Attending: Family Medicine | Admitting: Family Medicine

## 2020-01-18 DIAGNOSIS — Z01411 Encounter for gynecological examination (general) (routine) with abnormal findings: Secondary | ICD-10-CM

## 2020-11-15 ENCOUNTER — Other Ambulatory Visit: Payer: Self-pay | Admitting: Family Medicine

## 2020-11-15 DIAGNOSIS — Z1231 Encounter for screening mammogram for malignant neoplasm of breast: Secondary | ICD-10-CM

## 2021-01-01 ENCOUNTER — Other Ambulatory Visit: Payer: Self-pay

## 2021-01-01 ENCOUNTER — Ambulatory Visit
Admission: RE | Admit: 2021-01-01 | Discharge: 2021-01-01 | Disposition: A | Discharge status: Home / Self Care | Payer: Commercial Managed Care - PPO | Source: Ambulatory Visit | Attending: Family Medicine | Admitting: Family Medicine

## 2021-01-01 DIAGNOSIS — Z1231 Encounter for screening mammogram for malignant neoplasm of breast: Secondary | ICD-10-CM

## 2021-01-10 ENCOUNTER — Ambulatory Visit: Payer: Commercial Managed Care - PPO

## 2021-11-18 ENCOUNTER — Other Ambulatory Visit: Payer: Self-pay | Admitting: Family Medicine

## 2021-11-18 DIAGNOSIS — Z1231 Encounter for screening mammogram for malignant neoplasm of breast: Secondary | ICD-10-CM

## 2022-01-02 ENCOUNTER — Ambulatory Visit
Admission: RE | Admit: 2022-01-02 | Discharge: 2022-01-02 | Disposition: A | Discharge status: Home / Self Care | Payer: 59 | Source: Ambulatory Visit | Attending: Family Medicine | Admitting: Family Medicine

## 2022-01-02 DIAGNOSIS — Z1231 Encounter for screening mammogram for malignant neoplasm of breast: Secondary | ICD-10-CM

## 2022-01-06 ENCOUNTER — Other Ambulatory Visit: Payer: Self-pay | Admitting: Family Medicine

## 2022-01-06 DIAGNOSIS — R928 Other abnormal and inconclusive findings on diagnostic imaging of breast: Secondary | ICD-10-CM

## 2022-01-10 ENCOUNTER — Ambulatory Visit
Admission: RE | Admit: 2022-01-10 | Discharge: 2022-01-10 | Disposition: A | Discharge status: Home / Self Care | Payer: 59 | Source: Ambulatory Visit | Attending: Family Medicine | Admitting: Family Medicine

## 2022-01-10 DIAGNOSIS — R928 Other abnormal and inconclusive findings on diagnostic imaging of breast: Secondary | ICD-10-CM

## 2022-11-20 ENCOUNTER — Other Ambulatory Visit: Payer: Self-pay | Admitting: Family Medicine

## 2022-11-20 DIAGNOSIS — Z1231 Encounter for screening mammogram for malignant neoplasm of breast: Secondary | ICD-10-CM

## 2023-01-12 ENCOUNTER — Ambulatory Visit
Admission: RE | Admit: 2023-01-12 | Discharge: 2023-01-12 | Disposition: A | Discharge status: Home / Self Care | Payer: 59 | Source: Ambulatory Visit | Attending: Family Medicine | Admitting: Family Medicine

## 2023-01-12 DIAGNOSIS — Z1231 Encounter for screening mammogram for malignant neoplasm of breast: Secondary | ICD-10-CM

## 2023-07-13 IMAGING — MG MM DIGITAL SCREENING BILAT W/ TOMO AND CAD
8 series · 9 of 24 positions shown · non-contrast
Comparison: Previous exam(s).

CLINICAL DATA: Screening.

EXAM:
DIGITAL SCREENING BILATERAL MAMMOGRAM WITH TOMOSYNTHESIS AND CAD
TECHNIQUE: Bilateral screening digital craniocaudal and mediolateral oblique
mammograms were obtained. Bilateral screening digital breast
tomosynthesis was performed. The images were evaluated with
computer-aided detection.

[L MLO synth-2D]
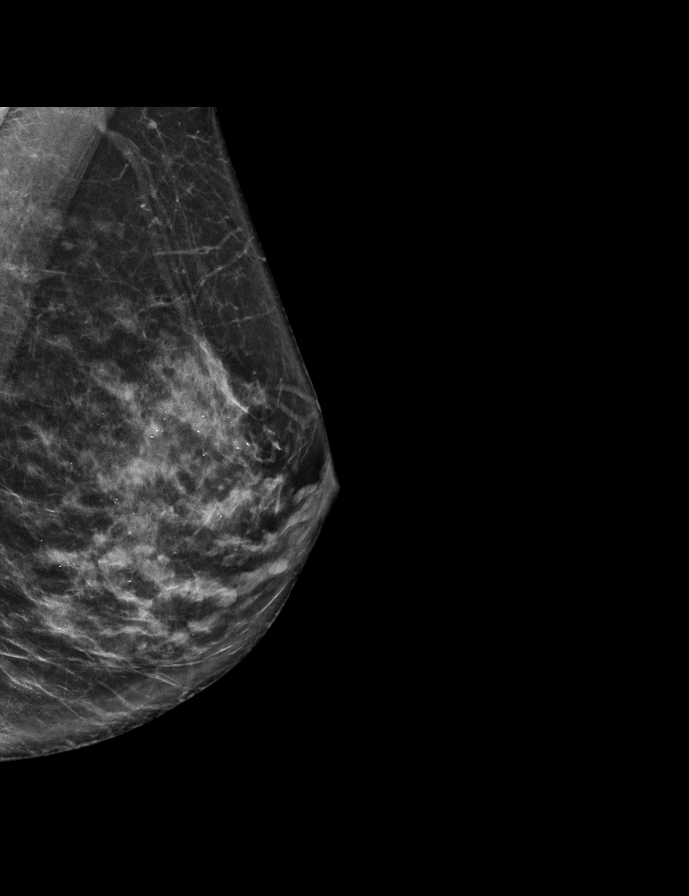

[L CC synth-2D]
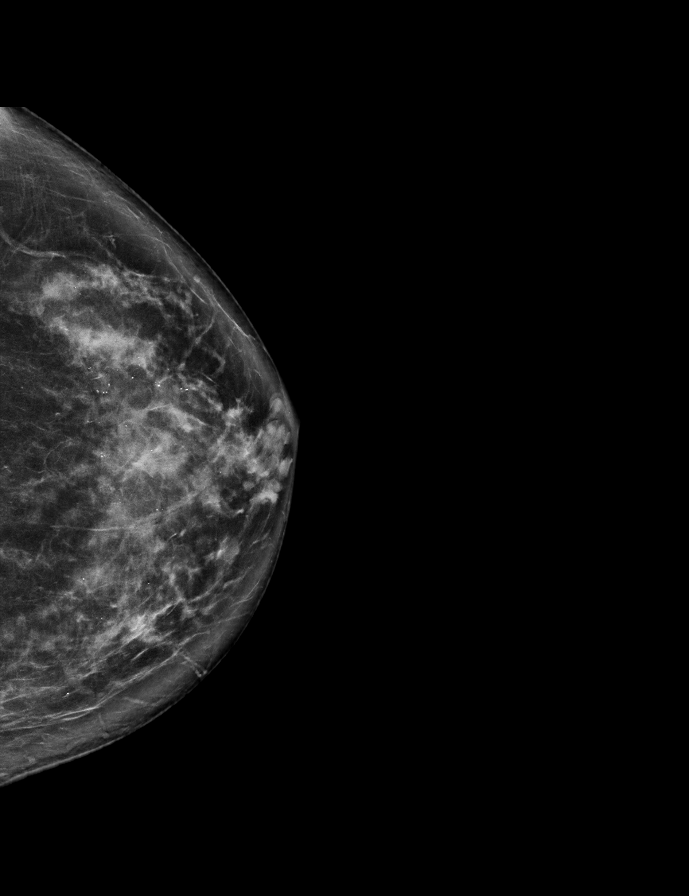

[R MLO synth-2D]
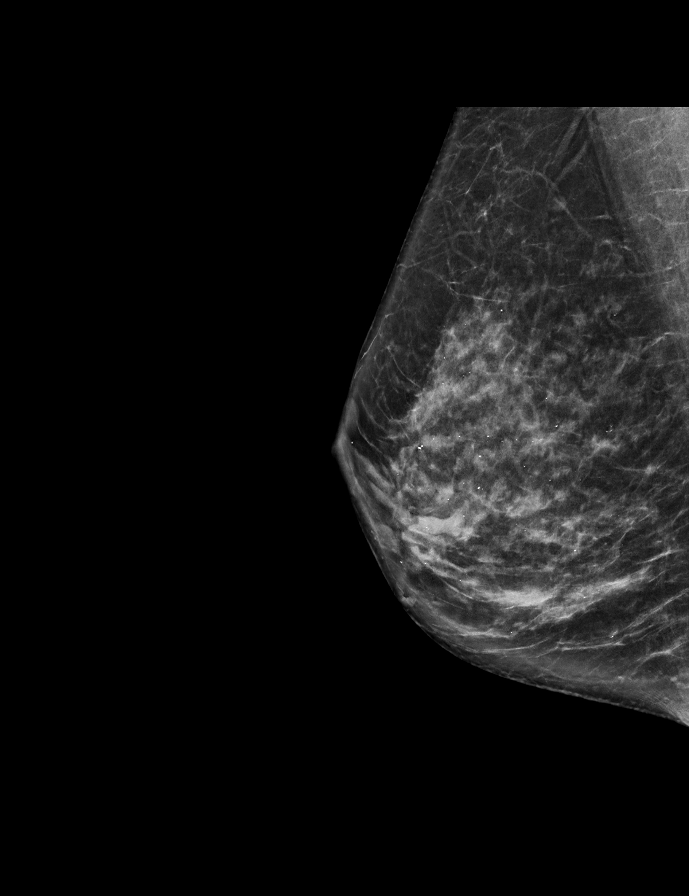

[R CC synth-2D]
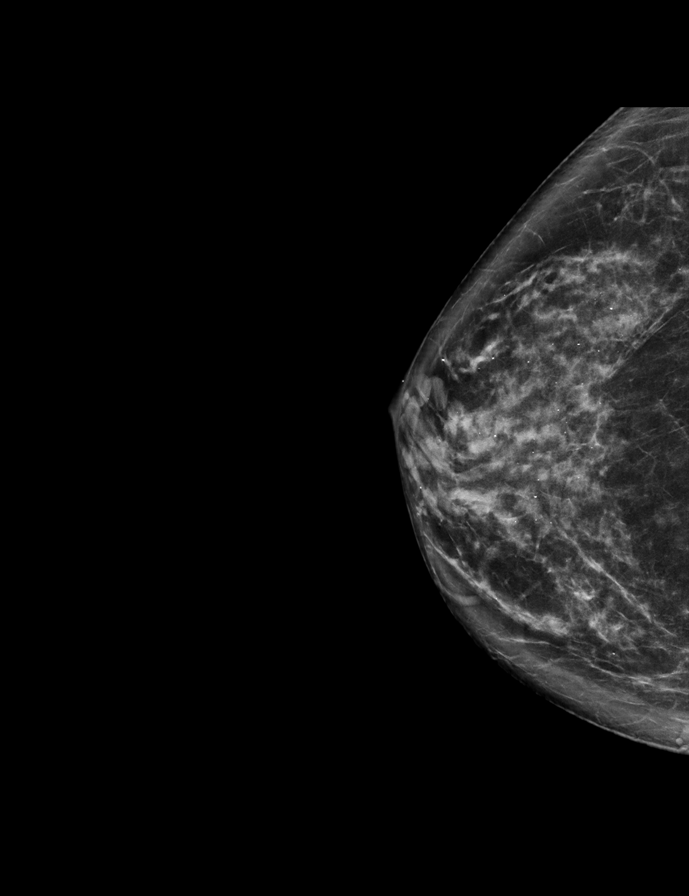

[R MLO tomo · 2 of 73 frames shown]
[frame 24/73]
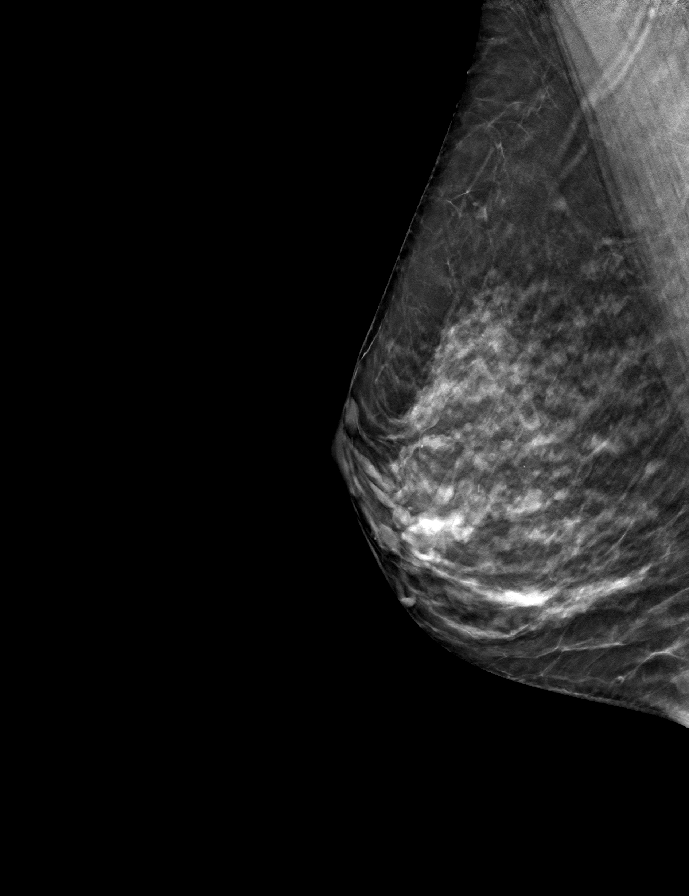
[frame 37/73]
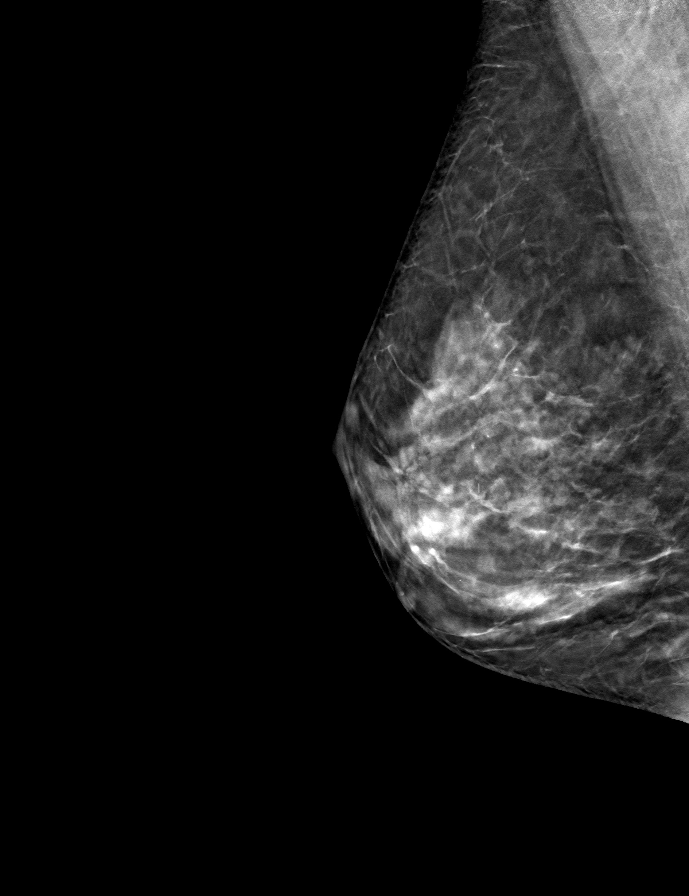

[L CC tomo · tomo slice 39/78.0]
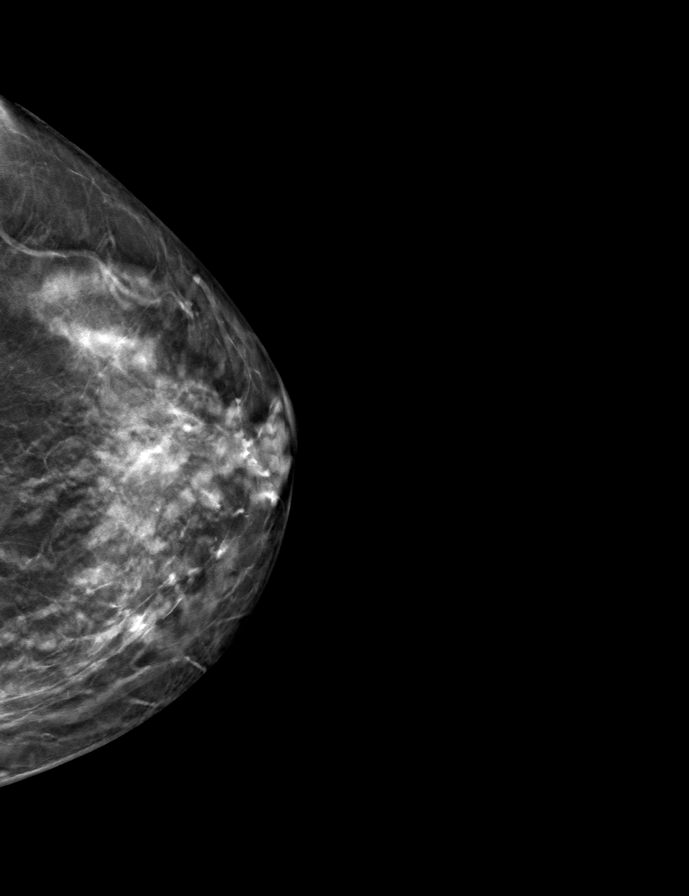

[L MLO tomo · tomo slice 36/71.0]
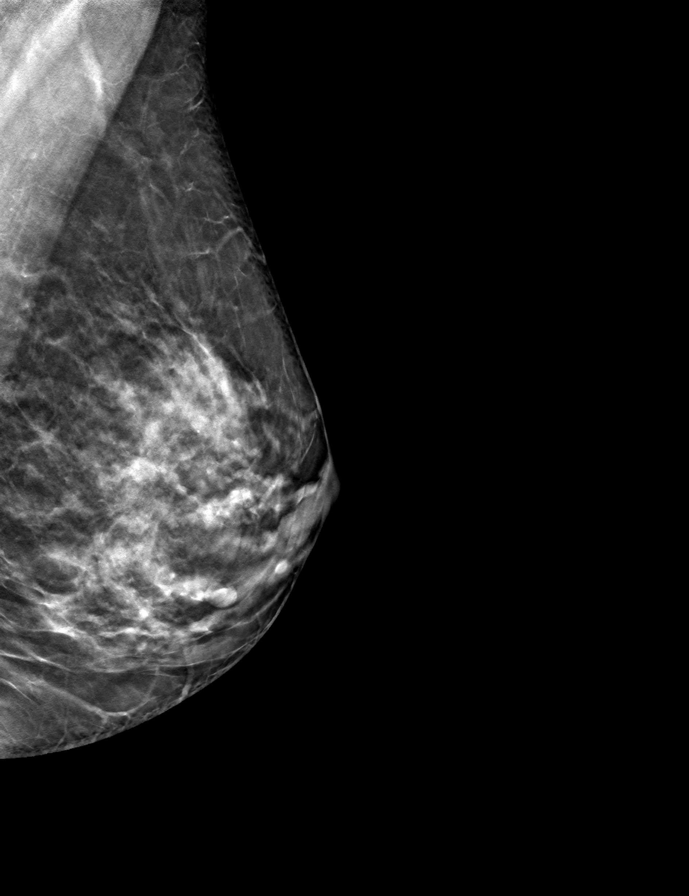

[R CC tomo · tomo slice 37/73.0]
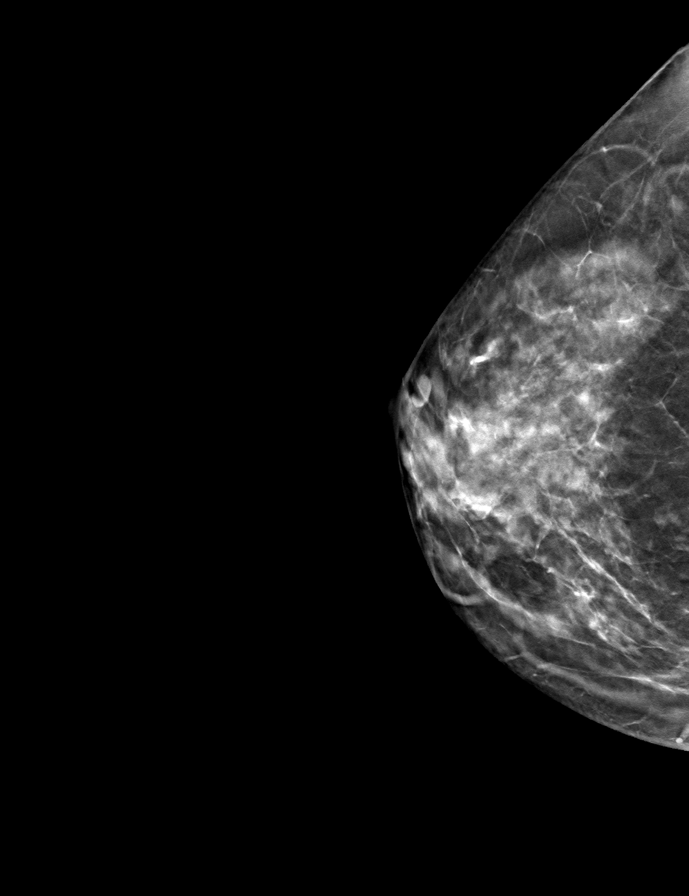

[9 of 24 positions shown; findings below may reference images not displayed]

ACR Breast Density Category c: The breast tissue is heterogeneously
dense, which may obscure small masses.
FINDINGS: There are no findings suspicious for malignancy.
IMPRESSION: No mammographic evidence of malignancy. A result letter of this
screening mammogram will be mailed directly to the patient.

RECOMMENDATION:
Screening mammogram in one year. (Code:Q3-W-BC3)

BI-RADS CATEGORY  1: Negative.

## 2023-11-30 ENCOUNTER — Other Ambulatory Visit: Payer: Self-pay | Admitting: Family Medicine

## 2023-11-30 DIAGNOSIS — Z1231 Encounter for screening mammogram for malignant neoplasm of breast: Secondary | ICD-10-CM

## 2024-01-13 ENCOUNTER — Ambulatory Visit
Admission: RE | Admit: 2024-01-13 | Discharge: 2024-01-13 | Disposition: A | Discharge status: Home / Self Care | Source: Ambulatory Visit | Attending: Family Medicine | Admitting: Family Medicine

## 2024-01-13 DIAGNOSIS — Z1231 Encounter for screening mammogram for malignant neoplasm of breast: Secondary | ICD-10-CM
